# Patient Record
Sex: Female | Born: 1990
Health system: Southern US, Community
[De-identification: ages and names within clinical notes are randomized; demographics above are authoritative.]

---
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
## 2009-03-11 ENCOUNTER — Emergency Department (HOSPITAL_COMMUNITY): Admission: EM | Admit: 2009-03-11 | Discharge: 2009-03-11 | Payer: Self-pay | Admitting: Emergency Medicine

## 2012-01-22 ENCOUNTER — Ambulatory Visit (INDEPENDENT_AMBULATORY_CARE_PROVIDER_SITE_OTHER): Payer: BC Managed Care – PPO | Admitting: Physician Assistant

## 2012-01-22 VITALS — BP 134/74 | HR 96 | Temp 98.9°F | Resp 16 | Ht 65.0 in | Wt 132.0 lb

## 2012-01-22 DIAGNOSIS — J111 Influenza due to unidentified influenza virus with other respiratory manifestations: Secondary | ICD-10-CM

## 2012-01-22 DIAGNOSIS — J029 Acute pharyngitis, unspecified: Secondary | ICD-10-CM

## 2012-01-22 DIAGNOSIS — R6883 Chills (without fever): Secondary | ICD-10-CM

## 2012-01-22 DIAGNOSIS — R52 Pain, unspecified: Secondary | ICD-10-CM

## 2012-01-22 LAB — POCT RAPID STREP A (OFFICE): Rapid Strep A Screen: NEGATIVE

## 2012-01-22 MED ORDER — HYDROCODONE-HOMATROPINE 5-1.5 MG/5ML PO SYRP: 5.0000 mL | ORAL_SOLUTION | Freq: Three times a day (TID) | ORAL | Status: DC | PRN

## 2012-01-22 MED ORDER — FIRST-DUKES MOUTHWASH MT SUSP: 10.0000 mL | OROMUCOSAL | Status: DC | PRN

## 2012-01-22 MED ORDER — OSELTAMIVIR PHOSPHATE 75 MG PO CAPS: 75.0000 mg | ORAL_CAPSULE | Freq: Two times a day (BID) | ORAL | Status: DC

## 2012-01-22 NOTE — Patient Instructions (Addendum)
Begin taking the Tamiflu today.  Be sure to finish the full course.  Plenty of fluids and rest.  Good hand hygiene.  Cover your mouth when coughing or sneezing.  You may use Magic Mouthwash as frequently as every 2 hours for sore throat.  Advil 600mg  every 8 hours for fever/pain relief.  You may alternate this with Tylenol if needed.  Hycodan syrup for cough - this may make you sleepy.  Please let us know if you are worsening or not improving.     Influenza Facts Flu (influenza) is a contagious respiratory illness caused by the influenza viruses. It can cause mild to severe illness. While most healthy people recover from the flu without specific treatment and without complications, older people, young children, and people with certain health conditions are at higher risk for serious complications from the flu, including death. CAUSES   The flu virus is spread from person to person by respiratory droplets from coughing and sneezing.  A person can also become infected by touching an object or surface with a virus on it and then touching their mouth, eye or nose.  Adults may be able to infect others from 1 day before symptoms occur and up to 7 days after getting sick. So it is possible to give someone the flu even before you know you are sick and continue to infect others while you are sick. SYMPTOMS   Fever (usually high).  Headache.  Tiredness (can be extreme).  Cough.  Sore throat.  Runny or stuffy nose.  Body aches.  Diarrhea and vomiting may also occur, particularly in children.  These symptoms are referred to as "flu-like symptoms". A lot of different illnesses, including the common cold, can have similar symptoms. DIAGNOSIS   There are tests that can determine if you have the flu as long you are tested within the first 2 or 3 days of illness.  A doctor's exam and additional tests may be needed to identify if you have a disease that is a complicating the flu. RISKS AND  COMPLICATIONS  Some of the complications caused by the flu include:  Bacterial pneumonia or progressive pneumonia caused by the flu virus.  Loss of body fluids (dehydration).  Worsening of chronic medical conditions, such as heart failure, asthma, or diabetes.  Sinus problems and ear infections. HOME CARE INSTRUCTIONS   Seek medical care early on.  If you are at high risk from complications of the flu, consult your health-care provider as soon as you develop flu-like symptoms. Those at high risk for complications include:  People 65 years or older.  People with chronic medical conditions, including diabetes.  Pregnant women.  Young children.  Your caregiver may recommend use of an antiviral medication to help treat the flu.  If you get the flu, get plenty of rest, drink a lot of liquids, and avoid using alcohol and tobacco.  You can take over-the-counter medications to relieve the symptoms of the flu if your caregiver approves. (Never give aspirin to children or teenagers who have flu-like symptoms, particularly fever). PREVENTION  The single best way to prevent the flu is to get a flu vaccine each fall. Other measures that can help protect against the flu are:  Antiviral Medications  A number of antiviral drugs are approved for use in preventing the flu. These are prescription medications, and a doctor should be consulted before they are used.  Habits for Good Health  Cover your nose and mouth with a tissue when you  cough or sneeze, throw the tissue away after you use it.  Wash your hands often with soap and water, especially after you cough or sneeze. If you are not near water, use an alcohol-based hand cleaner.  Avoid people who are sick.  If you get the flu, stay home from work or school. Avoid contact with other people so that you do not make them sick, too.  Try not to touch your eyes, nose, or mouth as germs ore often spread this way. IN CHILDREN, EMERGENCY  WARNING SIGNS THAT NEED URGENT MEDICAL ATTENTION:  Fast breathing or trouble breathing.  Bluish skin color.  Not drinking enough fluids.  Not waking up or not interacting.  Being so irritable that the child does not want to be held.  Flu-like symptoms improve but then return with fever and worse cough.  Fever with a rash. IN ADULTS, EMERGENCY WARNING SIGNS THAT NEED URGENT MEDICAL ATTENTION:  Difficulty breathing or shortness of breath.  Pain or pressure in the chest or abdomen.  Sudden dizziness.  Confusion.  Severe or persistent vomiting. SEEK IMMEDIATE MEDICAL CARE IF:  You or someone you know is experiencing any of the symptoms above. When you arrive at the emergency center,report that you think you have the flu. You may be asked to wear a mask and/or sit in a secluded area to protect others from getting sick. MAKE SURE YOU:   Understand these instructions.  Monitor your condition.  Seek medical care if you are getting worse, or not improving. Document Released: 12/28/2002 Document Revised: 03/19/2011 Document Reviewed: 09/23/2008 [facility] Patient Information 2013 [place], [place].

## 2012-01-22 NOTE — Progress Notes (Signed)
Subjective:    Patient ID: Jordan Short, female    DOB: 1990/07/01, 22 y.o.   MRN: [id number]  HPI   Jordan Short is a 22 yr old female here stating Jordan Short is not feeling well.  Started coughing a little yesterday, then woke up in the middle of the feeling really bad.  Has had body aches and chills, not sure if Jordan Short's had a fever.  Took Advil about 2 hours ago, no temp currently.  Has sore throat and headache.  States cough is non-productive but Jordan Short "tastes blood" every time Jordan Short coughs.  Has not seen any blood.  No flu shot this season.  Sister and dad have been sick recently.     Review of Systems  Constitutional: Positive for chills. Negative for fever.  HENT: Positive for sore throat. Negative for congestion, rhinorrhea, sneezing, neck pain, neck stiffness and sinus pressure.   Respiratory: Positive for cough. Negative for shortness of breath and wheezing.   Cardiovascular: Negative.   Gastrointestinal: Positive for nausea. Negative for vomiting, abdominal pain and diarrhea.  Musculoskeletal: Positive for myalgias and arthralgias.  Skin: Negative.   Neurological: Positive for headaches.       Objective:   Physical Exam  Vitals reviewed. Constitutional: Jordan Short is oriented to person, place, and time. Jordan Short appears well-developed and well-nourished. Jordan Short appears ill. No distress.  HENT:  Head: Normocephalic and atraumatic.  Right Ear: Tympanic membrane and ear canal normal.  Left Ear: Tympanic membrane and ear canal normal.  Nose: Nose normal. Right sinus exhibits no maxillary sinus tenderness and no frontal sinus tenderness. Left sinus exhibits no maxillary sinus tenderness and no frontal sinus tenderness.  Mouth/Throat: Uvula is midline and mucous membranes are normal. Posterior oropharyngeal erythema present. No oropharyngeal exudate or posterior oropharyngeal edema.  Eyes: Conjunctivae normal are normal. No scleral icterus.  Neck: Neck supple.  Cardiovascular: Normal rate, regular rhythm, normal  heart sounds and intact distal pulses.  Exam reveals no gallop and no friction rub.   No murmur heard. Pulmonary/Chest: Effort normal and breath sounds normal. Jordan Short has no wheezes. Jordan Short has no rales.  Abdominal: Soft. Bowel sounds are normal. There is no tenderness.  Lymphadenopathy:    Jordan Short has no cervical adenopathy.  Neurological: Jordan Short is alert and oriented to person, place, and time.  Skin: Skin is warm and dry.  Psychiatric: Jordan Short has a normal mood and affect. Jordan Short behavior is normal.      Filed Vitals:   01/22/12 1414  BP: 134/74  Pulse: 96  Temp: 98.9 F (37.2 C)  Resp: 16     Results for orders placed in visit on 01/22/12  POCT RAPID STREP A (OFFICE)      Component Value Range   Rapid Strep A Screen Negative  Negative    Lab error with rapid flu test     Assessment & Plan:   1. Influenza-like illness  HYDROcodone-homatropine (HYCODAN) 5-1.5 MG/5ML syrup, oseltamivir (TAMIFLU) 75 MG capsule  2. Chills    3. Body aches    4. Sore throat  POCT rapid strep A, Diphenhyd-Hydrocort-Nystatin (FIRST-DUKES MOUTHWASH) SUSP    Jordan Short is a pleasant 22 yr old female with flu-like symptoms.  Rapid strep is negative.  A lab error occurred with the rapid flu test and it did not result.  Clinically this appears to be flu.  Gave the Jordan Short the option of re-swabbing for flu or treating empirically.  Jordan Short opted to treat empirically with Tamiflu.  Will treat cough with Hycodan  and sore throat with magic mouthwash.  Encouraged fluids and rest.  Discussed RTC precautions, Jordan Short understands and will return if worsening or not improving.

## 2013-11-25 ENCOUNTER — Encounter (HOSPITAL_COMMUNITY): Payer: Self-pay | Admitting: *Deleted

## 2013-11-25 ENCOUNTER — Emergency Department (HOSPITAL_COMMUNITY)
Admission: EM | Admit: 2013-11-25 | Discharge: 2013-11-25 | Disposition: A | Discharge status: Home / Self Care | Payer: BC Managed Care – PPO | Source: Home / Self Care | Attending: Family Medicine | Admitting: Family Medicine

## 2013-11-25 DIAGNOSIS — M533 Sacrococcygeal disorders, not elsewhere classified: Secondary | ICD-10-CM

## 2013-11-25 NOTE — Discharge Instructions (Signed)
You likely have irritated the SI joint in your low back. If you have discomfort in this area you can take ibuprofen 600 mg every 6 hours as needed. If this continues please call Dr Michaelle CopasSmith's office to schedule follow-up.   Back Pain, Adult Back pain is very common. The pain often gets better over time. The cause of back pain is usually not dangerous. Most people can learn to manage their back pain on their own.  HOME CARE   Stay active. Start with short walks on flat ground if you can. Try to walk farther each day.  Do not sit, drive, or stand in one place for more than 30 minutes. Do not stay in bed.  Do not avoid exercise or work. Activity can help your back heal faster.  Be careful when you bend or lift an object. Bend at your knees, keep the object close to you, and do not twist.  Sleep on a firm mattress. Lie on your side, and bend your knees. If you lie on your back, put a pillow under your knees.  Only take medicines as told by your doctor.  Put ice on the injured area.  Put ice in a plastic bag.  Place a towel between your skin and the bag.  Leave the ice on for 15-20 minutes, 03-04 times a day for the first 2 to 3 days. After that, you can switch between ice and heat packs.  Ask your doctor about back exercises or massage.  Avoid feeling anxious or stressed. Find good ways to deal with stress, such as exercise. GET HELP RIGHT AWAY IF:   Your pain does not go away with rest or medicine.  Your pain does not go away in 1 week.  You have new problems.  You do not feel well.  The pain spreads into your legs.  You cannot control when you poop (bowel movement) or pee (urinate).  Your arms or legs feel weak or lose feeling (numbness).  You feel sick to your stomach (nauseous) or throw up (vomit).  You have belly (abdominal) pain.  You feel like you may pass out (faint). MAKE SURE YOU:   Understand these instructions.  Will watch your condition.  Will get help  right away if you are not doing well or get worse. Document Released: 06/13/2007 Document Revised: 03/19/2011 Document Reviewed: 04/28/2013 [facility] Patient Information 2015 [place], [place]. This information is not intended to replace advice given to you by your health care provider. Make sure you discuss any questions you have with your health care provider.

## 2013-11-25 NOTE — ED Notes (Signed)
Pt  Reports  She noticed     What she  Described  As  A   Firm        Swollen  Area to  Lower  Back        Mildly     Sore to  Touch      denys  Any  specefic  Injury  But  Has  Been  Working  Out  Some  Lately

## 2013-11-25 NOTE — ED Provider Notes (Signed)
CSN: [id number]     Arrival date & time 11/25/13  1533 History   First MD Initiated Contact with Patient 11/25/13 1552     Chief Complaint  Patient presents with  . Back Pain   (Consider location/radiation/quality/duration/timing/severity/associated sxs/prior Treatment) Patient is a 23 y.o. female presenting with back pain. The history is provided by the patient.  Back Pain Location:  Sacro-iliac joint Quality:  Aching Radiates to:  Does not radiate Pain severity:  No pain Duration:  1 day Timing:  Intermittent Progression:  Resolved Chronicity:  New Context: lifting heavy objects   Associated symptoms: no fever, no numbness and no weakness    Patient is a 23 yo female presenting with back pain. Notes her back started to throb yesterday in her left lower back. She felt the area and noted a prominent area off the bone that she had not noted previously. She notes her pain has resolved. It did not radiate. There is no weakness or numbness. Denies saddle anesthesia, bowel or bladder incontinence, fevers, and history of cancer. She does note she has been going to the gym more recently.   History reviewed. No pertinent past medical history. History reviewed. No pertinent past surgical history. Family History  Problem Relation Age of Onset  . Hypertension Mother   . Hypertension Father    History  Substance Use Topics  . Smoking status: Never Smoker   . Smokeless tobacco: Not on file  . Alcohol Use: No   OB History    No data available     Review of Systems  Constitutional: Negative for fever.  Musculoskeletal: Positive for back pain. Negative for gait problem.       Bony prominence in low back  Neurological: Negative for weakness and numbness.    Allergies  Review of patient's allergies indicates no known allergies.  Home Medications   Prior to Admission medications   Medication Sig Start Date End Date Taking? Authorizing Provider  Diphenhyd-Hydrocort-Nystatin  (FIRST-DUKES MOUTHWASH) SUSP Take 10 mLs by mouth every 2 (two) hours as needed. With 1:1 ratio viscous lidocaine 01/22/12   Godfrey PickEleanore E Egan, PA-C  etonogestrel-ethinyl estradiol (NUVARING) 0.12-0.015 MG/24HR vaginal ring Place 1 each vaginally every 28 (twenty-eight) days. Insert vaginally and leave in place for 3 consecutive weeks, then remove for 1 week.    Historical Provider, MD  HYDROcodone-homatropine (HYCODAN) 5-1.5 MG/5ML syrup Take 5 mLs by mouth every 8 (eight) hours as needed for cough. 01/22/12   Eleanore Delia ChimesE Egan, PA-C  oseltamivir (TAMIFLU) 75 MG capsule Take 1 capsule (75 mg total) by mouth 2 (two) times daily. 01/22/12   Eleanore E Egan, PA-C   BP 133/82 mmHg  Pulse 77  Temp(Src) 98.5 F (36.9 C) (Oral)  Resp 16  SpO2 96%  LMP 11/04/2013 Physical Exam  Constitutional: She appears well-developed and well-nourished.  HENT:  Head: Normocephalic and atraumatic.  Cardiovascular: Normal rate, regular rhythm and normal heart sounds.   Pulmonary/Chest: Effort normal and breath sounds normal.  Musculoskeletal: She exhibits no edema.  No midline spinal tenderness, no tenderness of back in any distribution, there is a small nodule over the left SI joint, there is no overlying erythema or swelling, there is no tenderness to this area  Neurological:  5/5 strength in bilateral quads, hamstrings, plantar and dorsiflexion, sensation to light touch intact in bilateral LE, normal gait, 2+ patellar reflexes  Skin: Skin is warm and dry.    ED Course  Procedures (including critical care time) Labs Review Labs  Reviewed - No data to display  Imaging Review No results found.   MDM   1. SI (sacroiliac) joint dysfunction    Patient with likely fascial swelling off of the SI joint relating to recent increase in physical activity. No red flags on history or exam. Neurologically intact. Discussed ibuprofen and tylenol for any future discomfort. Is to f/u with Dr Terrilee FilesZach Smith if not improving in  4-6 weeks. Given return precautions.   This patient was discussed with and seen by Dr Denyse Amassorey. He helped formulate the plan of care for the patient.   Marikay AlarEric Anne [place], MD [facility] PGY3    Glori LuisEric G Tamela Elsayed, MD 11/25/13 1630

## 2018-03-10 ENCOUNTER — Encounter (HOSPITAL_COMMUNITY): Payer: Self-pay | Admitting: Emergency Medicine

## 2018-03-10 ENCOUNTER — Ambulatory Visit (HOSPITAL_COMMUNITY)
Admission: EM | Admit: 2018-03-10 | Discharge: 2018-03-10 | Disposition: A | Discharge status: Home / Self Care | Payer: BLUE CROSS/BLUE SHIELD | Attending: Family Medicine | Admitting: Family Medicine

## 2018-03-10 ENCOUNTER — Ambulatory Visit (INDEPENDENT_AMBULATORY_CARE_PROVIDER_SITE_OTHER): Payer: BLUE CROSS/BLUE SHIELD

## 2018-03-10 DIAGNOSIS — W19XXXA Unspecified fall, initial encounter: Secondary | ICD-10-CM

## 2018-03-10 DIAGNOSIS — M25572 Pain in left ankle and joints of left foot: Secondary | ICD-10-CM

## 2018-03-10 DIAGNOSIS — M25472 Effusion, left ankle: Secondary | ICD-10-CM | POA: Diagnosis not present

## 2018-03-10 DIAGNOSIS — S93492A Sprain of other ligament of left ankle, initial encounter: Secondary | ICD-10-CM | POA: Diagnosis not present

## 2018-03-10 NOTE — ED Triage Notes (Signed)
Pt here for left ankle pain after twisting 2 days ago; bruising and swelling noted

## 2018-03-10 NOTE — ED Notes (Signed)
Medium ASO applied to patient's left ankle by Aggie Cosier, CMA

## 2018-03-10 NOTE — ED Provider Notes (Signed)
[facility]   [id number] 03/10/18 Arrival Time: 6384  ASSESSMENT & PLAN:  1. Sprain of anterior talofibular ligament of left ankle, initial encounter     I have personally viewed the imaging studies ordered this visit. No fracture or dislocation observed.  Imaging: Dg Ankle Complete Left  Result Date: 03/10/2018 CLINICAL DATA:  Fall 2 days ago with left ankle pain and swelling extending to the base of the fifth metatarsal. Evaluate for fracture. EXAM: LEFT ANKLE COMPLETE - 3+ VIEW COMPARISON:  None. FINDINGS: Soft tissue swelling about the lateral malleolus without associated fracture or dislocation. Joint spaces are preserved. Ankle mortise is preserved. Potential small ankle joint effusion. No plantar calcaneal spur. No radiopaque foreign body. IMPRESSION: Soft tissue swelling about the lateral malleolus with potential small ankle joint effusion but without associated fracture or dislocation. Electronically Signed   By: Simonne Come M.D.   On: 03/10/2018 10:38   Orders Placed This Encounter  Procedures  . DG Ankle Complete Left  . Apply ASO ankle   AVS with written ankle sprain instructions. WBAT.  Follow-up Information    [facility] In 1 week.   Specialty:  Urgent Care Why:  If not improving. Contact information: [address] [place] [address] [phone]          Reviewed expectations re: course of current medical issues. Questions answered. Outlined signs and symptoms indicating need for more acute intervention. Patient verbalized understanding. After Visit Summary given.  SUBJECTIVE: History from: patient. Jordan Short is a 28 y.o. female who reports fairly persistent mild to moderate pain of her left ankle; described as aching without radiation. Onset: abrupt, 2 days ago. Injury/trama: yes, reports "twisting" ankle; mild swelling noted. Able to bear weight immediately and since "but hurts". Symptoms  have progressed to a point and plateaued since beginning. Aggravating factors: certain movements. Alleviating factors: rest. Associated symptoms: none reported. Extremity sensation changes or weakness: none. Self treatment: has not tried OTCs for relief of pain. History of similar: no.  History reviewed. No pertinent surgical history.   ROS: As per HPI.   OBJECTIVE:  Vitals:   03/10/18 1014  BP: 138/84  Pulse: 68  Resp: 20  Temp: 98.4 F (36.9 C)  TempSrc: Oral  SpO2: 100%    General appearance: alert; no distress Extremities: . LLE: warm and well perfused; fairly well localized mild tenderness over left lateral ankle (ATFL distribution); without gross deformities; with mild swelling; with no bruising; ROM: normal CV: brisk extremity capillary refill of LLE; 2+ DP and PT pulse of LLE. Skin: warm and dry; no visible rashes Neurologic: gait normal; normal reflexes of RLE and LLE; normal sensation of RLE and LLE; normal strength of RLE and LLE Psychological: alert and cooperative; normal mood and affect  No Known Allergies  History reviewed. No pertinent past medical history. Social History   Socioeconomic History  . Marital status: Single    Spouse name: Not on file  . Number of children: Not on file  . Years of education: Not on file  . Highest education level: Not on file  Occupational History  . Not on file  Social Needs  . Financial resource strain: Not on file  . Food insecurity:    Worry: Not on file    Inability: Not on file  . Transportation needs:    Medical: Not on file    Non-medical: Not on file  Tobacco Use  . Smoking status: Never  Smoker  . Smokeless tobacco: Never Used  Substance and Sexual Activity  . Alcohol use: No  . Drug use: No  . Sexual activity: Yes    Comment: NuvaRing  Lifestyle  . Physical activity:    Days per week: Not on file    Minutes per session: Not on file  . Stress: Not on file  Relationships  . Social connections:      Talks on phone: Not on file    Gets together: Not on file    Attends religious service: Not on file    Active member of club or organization: Not on file    Attends meetings of clubs or organizations: Not on file    Relationship status: Not on file  Other Topics Concern  . Not on file  Social History Narrative  . Not on file   Family History  Problem Relation Age of Onset  . Hypertension Mother   . Hypertension Father    History reviewed. No pertinent surgical history.    Mardella Layman, MD 03/12/18 [phone]

## 2018-03-10 NOTE — Discharge Instructions (Signed)
You have been diagnosed with an ankle sprain today. Please wear the ankle brace provided for the next week. If possible, elevate your ankle when seated. Applying ice for 20 minutes at a time every hour as needed may help with any pain for swelling you may have. You may also take Ibuprofen 3 times daily for pain and inflammation. Follow up with your doctor or an orthopaedist in 1 week if you are not seeing significant improvement.

## 2018-08-12 DIAGNOSIS — Z6825 Body mass index (BMI) 25.0-25.9, adult: Secondary | ICD-10-CM | POA: Diagnosis not present

## 2018-08-12 DIAGNOSIS — Z130 Encounter for screening for diseases of the blood and blood-forming organs and certain disorders involving the immune mechanism: Secondary | ICD-10-CM | POA: Diagnosis not present

## 2018-08-12 DIAGNOSIS — Z01419 Encounter for gynecological examination (general) (routine) without abnormal findings: Secondary | ICD-10-CM | POA: Diagnosis not present

## 2018-08-12 DIAGNOSIS — Z3044 Encounter for surveillance of vaginal ring hormonal contraceptive device: Secondary | ICD-10-CM | POA: Diagnosis not present

## 2018-08-13 DIAGNOSIS — H5213 Myopia, bilateral: Secondary | ICD-10-CM | POA: Diagnosis not present

## 2018-08-13 DIAGNOSIS — H04123 Dry eye syndrome of bilateral lacrimal glands: Secondary | ICD-10-CM | POA: Diagnosis not present

## 2018-08-20 ENCOUNTER — Other Ambulatory Visit: Payer: Self-pay

## 2018-08-20 DIAGNOSIS — Z20822 Contact with and (suspected) exposure to covid-19: Secondary | ICD-10-CM

## 2018-08-21 LAB — NOVEL CORONAVIRUS, NAA: SARS-CoV-2, NAA: DETECTED — AB

## 2018-08-22 ENCOUNTER — Telehealth: Payer: BLUE CROSS/BLUE SHIELD

## 2018-08-23 ENCOUNTER — Telehealth: Payer: Self-pay | Admitting: Critical Care Medicine

## 2018-08-23 NOTE — Telephone Encounter (Signed)
I connected with this patient who apparently became ill on around 9 August underwent testing on the 12th which was positive for COVID.  The patient's had nasal congestion temperature of 101 sore throat and a slight cough and alterations in smell and taste.  Patient also has had diarrhea.  The patient is actually now rapidly getting better.  The patient denies any fatigue.  The patient does not have a primary care physician.  She mainly is suffering from anxiety over the positive test result.  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  Her father understands that he has been exposed to her.  She is self isolating and we discussed that that self-isolation should continue at least until 21 August.  The patient knows to go to the urgent care or emergency room if her symptoms worsen.  She is interested in being retesting I told her we are not recommending a retest to test for cure however some employer is requesting a retest I told her if her employer is requesting a retest the earliest that should be would be the week of 24 August

## 2018-09-05 ENCOUNTER — Other Ambulatory Visit: Payer: Self-pay

## 2018-09-05 ENCOUNTER — Ambulatory Visit (INDEPENDENT_AMBULATORY_CARE_PROVIDER_SITE_OTHER)
Admission: RE | Admit: 2018-09-05 | Discharge: 2018-09-05 | Disposition: A | Discharge status: Home / Self Care | Payer: BC Managed Care – PPO | Source: Ambulatory Visit

## 2018-09-05 DIAGNOSIS — Z8619 Personal history of other infectious and parasitic diseases: Secondary | ICD-10-CM | POA: Diagnosis not present

## 2018-09-05 DIAGNOSIS — Z711 Person with feared health complaint in whom no diagnosis is made: Secondary | ICD-10-CM | POA: Diagnosis not present

## 2018-09-05 NOTE — ED Provider Notes (Signed)
Virtual Visit via Video Note:  Jordan Short  initiated request for Telemedicine visit with [facility] team. I connected with Jordan Short  on 09/08/2018 at 8:17 AM  for a synchronized telemedicine visit using a video enabled HIPPA compliant telemedicine application. I verified that I am speaking with Jordan Short  using two identifiers. Orvan July, NP  was physically located in a [facility] Urgent care site and Myrtle Haller was located at a different location.   The limitations of evaluation and management by telemedicine as well as the availability of in-person appointments were discussed. Patient was informed that she  may incur a bill ( including co-pay) for this virtual visit encounter. Jordan Short  expressed understanding and gave verbal consent to proceed with virtual visit.     History of Present Illness:Jordan Short  is a 28 y.o. female presents with being worried. She was dx with COVID a few weeks ago and is feeling better. She is concerned if she is still contagious and worried about being around her dad and being around people. She wanted recommendations on being retested. No symptoms.   History reviewed. No pertinent past medical history.  No Known Allergies      Observations/Objective: VITALS: Per patient if applicable, see vitals. GENERAL: Alert, appears well and in no acute distress. HEENT: Atraumatic, conjunctiva clear, no obvious abnormalities on inspection of external nose and ears. NECK: Normal movements of the head and neck. CARDIOPULMONARY: No increased WOB. Speaking in clear sentences. I:E ratio WNL.  MS: Moves all visible extremities without noticeable abnormality. PSYCH: Pleasant and cooperative, well-groomed. Speech normal rate and rhythm. Affect is appropriate. Insight and judgement are appropriate. Attention is focused, linear, and appropriate.  NEURO: CN grossly intact. Oriented as arrived to appointment on time with no prompting. Moves both UE equally.   SKIN: No obvious lesions, wounds, erythema, or cyanosis noted on face or hands.     Assessment and Plan: Reassured patient and gave the CDC recommendations. No need for retesting   Follow Up Instructions: Follow up as needed for continued or worsening symptoms     I discussed the assessment and treatment plan with the patient. The patient was provided an opportunity to ask questions and all were answered. The patient agreed with the plan and demonstrated an understanding of the instructions.   The patient was advised to call back or seek an in-person evaluation if the symptoms worsen or if the condition fails to improve as anticipated.     Orvan July, NP  09/08/2018 8:17 AM          Orvan July, NP 09/08/18 [phone]

## 2019-08-06 ENCOUNTER — Telehealth: Payer: Self-pay

## 2019-08-06 NOTE — Telephone Encounter (Signed)
Lmom to confirm and screen for 08-10-19 ov. 

## 2019-08-07 ENCOUNTER — Telehealth: Payer: Self-pay

## 2019-08-07 NOTE — Telephone Encounter (Signed)
Confirmed appointment on 08/10/2019 and screened for covid. klh 

## 2019-08-10 ENCOUNTER — Other Ambulatory Visit: Payer: Self-pay

## 2019-08-10 ENCOUNTER — Ambulatory Visit: Payer: BC Managed Care – PPO | Admitting: Adult Health

## 2019-08-10 ENCOUNTER — Encounter: Payer: Self-pay | Admitting: Nurse Practitioner

## 2019-08-10 VITALS — BP 123/74 | HR 87 | Temp 98.1°F | Resp 16 | Ht 65.0 in | Wt 158.4 lb

## 2019-08-10 DIAGNOSIS — R002 Palpitations: Secondary | ICD-10-CM

## 2019-08-10 DIAGNOSIS — F419 Anxiety disorder, unspecified: Secondary | ICD-10-CM | POA: Diagnosis not present

## 2019-08-10 DIAGNOSIS — F439 Reaction to severe stress, unspecified: Secondary | ICD-10-CM

## 2019-08-10 DIAGNOSIS — R197 Diarrhea, unspecified: Secondary | ICD-10-CM

## 2019-08-10 DIAGNOSIS — Z7689 Persons encountering health services in other specified circumstances: Secondary | ICD-10-CM | POA: Diagnosis not present

## 2019-08-10 DIAGNOSIS — R5383 Other fatigue: Secondary | ICD-10-CM

## 2019-08-10 MED ORDER — ALPRAZOLAM 0.25 MG PO TABS: ORAL_TABLET | ORAL | 0 refills | Status: DC

## 2019-08-10 NOTE — Progress Notes (Signed)
[facility] [address] [place], [place] [address]  Internal MEDICINE  Office Visit Note  Patient Name: Jordan Short  [id number]  [id number]  Date of Service: 08/10/2019   Complaints/HPI Pt is here for establishment of PCP. Chief Complaint  Patient presents with  . New Patient (Initial Visit)    gut issues  . Anxiety  . Quality Metric Gaps    pap, covid vaccine, hep C   HPI Pt is here establish care.  She is a well appearing 29 yo female. 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 She sees GYN for her PAPs.  She denies any significant medical history.  She is here to day to discuss some ongoing anxiety.  She describes herself as an anxious person, and she feels like this has become worse over the last 2 years.  She has never been prescribed any daily medications. She reports she has had a few self described panic attacks and they are becoming more frequent.  She also complains of some IBS like symptoms.  She feels like she goes from constipated to diarrhea often.  She reports regular bowel movements every 2-3 days.   Pt is also palpitations at times with her anxiety.  She reports she will wake up gasping in the middle of the night, and she panics.  She describes it as feeling like her heart will stop.   Current Medication: Outpatient Encounter Medications as of 08/10/2019  Medication Sig  . etonogestrel-ethinyl estradiol (NUVARING) 0.12-0.015 MG/24HR vaginal ring Place 1 each vaginally every 28 (twenty-eight) days. Insert vaginally and leave in place for 3 consecutive weeks, then remove for 1 week.  . ALPRAZolam (XANAX) 0.25 MG tablet Half to one tab tab as needed for panic attacks   No facility-administered encounter medications on file as of 08/10/2019.    Surgical History: History reviewed. No pertinent surgical history.  Medical History: History reviewed. No pertinent past medical history.  Family  History: Family History  Problem Relation Age of Onset  . Hypertension Mother   . Hypertension Father     Social History   Socioeconomic History  . Marital status: Single    Spouse name: Not on file  . Number of children: Not on file  . Years of education: Not on file  . Highest education level: Not on file  Occupational History  . Not on file  Tobacco Use  . Smoking status: Never Smoker  . Smokeless tobacco: Never Used  Substance and Sexual Activity  . Alcohol use: Yes    Comment: occ  . Drug use: Never  . Sexual activity: Yes    Comment: NuvaRing  Other Topics Concern  . Not on file  Social History Narrative  . Not on file   Social Determinants of Health   Financial Resource Strain:   . Difficulty of Paying Living Expenses:   Food Insecurity:   . Worried About Programme researcher, broadcasting/film/video in the Last Year:   . Barista in the Last Year:   Transportation Needs:   . Freight forwarder (Medical):   Marland Kitchen Lack of Transportation (Non-Medical):   Physical Activity:   . Days of Exercise per Week:   . Minutes of Exercise per Session:   Stress:   . Feeling of Stress :   Social Connections:   . Frequency of Communication with Friends and Family:   . Frequency of Social Gatherings with Friends  and Family:   . Attends Religious Services:   . Active Member of Clubs or Organizations:   . Attends Banker Meetings:   Marland Kitchen Marital Status:   Intimate Partner Violence:   . Fear of Current or Ex-Partner:   . Emotionally Abused:   Marland Kitchen Physically Abused:   . Sexually Abused:      Review of Systems  Constitutional: Negative for chills, fatigue and unexpected weight change.  HENT: Negative for congestion, rhinorrhea, sneezing and sore throat.   Eyes: Negative for photophobia, pain and redness.  Respiratory: Negative for cough, chest tightness and shortness of breath.   Cardiovascular: Negative for chest pain and palpitations.  Gastrointestinal: Negative for  abdominal pain, constipation, diarrhea, nausea and vomiting.  Endocrine: Negative.   Genitourinary: Negative for dysuria and frequency.  Musculoskeletal: Negative for arthralgias, back pain, joint swelling and neck pain.  Skin: Negative for rash.  Allergic/Immunologic: Negative.   Neurological: Negative for tremors and numbness.  Hematological: Negative for adenopathy. Does not bruise/bleed easily.  Psychiatric/Behavioral: Negative for behavioral problems and sleep disturbance. The patient is not nervous/anxious.     Vital Signs: BP 123/74   Pulse 87   Temp 98.1 F (36.7 C)   Resp 16   Ht 5\' 5"  (1.651 m)   Wt 158 lb 6.4 oz (71.8 kg)   SpO2 99%   BMI 26.36 kg/m    Physical Exam Vitals and nursing note reviewed.  Constitutional:      General: She is not in acute distress.    Appearance: She is well-developed. She is not diaphoretic.  HENT:     Head: Normocephalic and atraumatic.     Mouth/Throat:     Pharynx: No oropharyngeal exudate.  Eyes:     Pupils: Pupils are equal, round, and reactive to light.  Neck:     Thyroid: No thyromegaly.     Vascular: No JVD.     Trachea: No tracheal deviation.  Cardiovascular:     Rate and Rhythm: Normal rate and regular rhythm.     Heart sounds: Normal heart sounds. No murmur heard.  No friction rub. No gallop.   Pulmonary:     Effort: Pulmonary effort is normal. No respiratory distress.     Breath sounds: Normal breath sounds. No wheezing or rales.  Chest:     Chest wall: No tenderness.  Abdominal:     Palpations: Abdomen is soft.     Tenderness: There is no abdominal tenderness. There is no guarding.  Musculoskeletal:        General: Normal range of motion.     Cervical back: Normal range of motion and neck supple.  Lymphadenopathy:     Cervical: No cervical adenopathy.  Skin:    General: Skin is warm and dry.  Neurological:     Mental Status: She is alert and oriented to person, place, and time.     Cranial Nerves: No  cranial nerve deficit.  Psychiatric:        Behavior: Behavior normal.        Thought Content: Thought content normal.        Judgment: Judgment normal.     Assessment/Plan: 1. Encounter to establish care with new doctor Discussed need for Pap, will discuss further at next visit.  - CBC with Differential/Platelet - Lipid Panel With LDL/HDL Ratio - TSH - T4, free - Comprehensive metabolic panel  2. Stress Reviewed risks and possible side effects associated with taking opiates, benzodiazepines and other CNS depressants.  Combination of these could cause dizziness and drowsiness. Advised patient not to drive or operate machinery when taking these medications, as patient's and other's life can be at risk and will have consequences. Patient verbalized understanding in this matter. Dependence and abuse for these drugs will be monitored closely. A Controlled substance policy and procedure is on file which allows [facility] to order a urine drug screen test at any visit. Patient understands and agrees with the plan - ALPRAZolam (XANAX) 0.25 MG tablet; Half to one tab tab as needed for panic attacks  Dispense: 20 tablet; Refill: 0  3. Anxiety Stable, continue Xanax as directed.  - ALPRAZolam (XANAX) 0.25 MG tablet; Half to one tab tab as needed for panic attacks  Dispense: 20 tablet; Refill: 0  4. Diarrhea, unspecified type Continue dietary modifications and keep food log.   5. Other fatigue - Vitamin D (25 hydroxy) - B12 and Folate Panel - Fe+TIBC+Fer  6. Palpitation Intermittent, will review labs and we discussed posiblely ordering holter monitor and echo.   General Counseling: Kriti verbalizes understanding of the findings of todays visit and agrees with plan of treatment. I have discussed any further diagnostic evaluation that may be needed or ordered today. We also reviewed her medications today. she has been encouraged to call the office with any questions or concerns  that should arise related to todays visit.  Orders Placed This Encounter  Procedures  . CBC with Differential/Platelet  . Lipid Panel With LDL/HDL Ratio  . TSH  . T4, free  . Comprehensive metabolic panel  . Vitamin D (25 hydroxy)  . B12 and Folate Panel  . Fe+TIBC+Fer    Meds ordered this encounter  Medications  . ALPRAZolam (XANAX) 0.25 MG tablet    Sig: Half to one tab tab as needed for panic attacks    Dispense:  20 tablet    Refill:  0    Time spent: 30 Minutes   This patient was seen by Blima Ledger AGNP-C in Collaboration with Dr Lyndon Code as a part of collaborative care agreement  Johnna Acosta AGNP-C Internal Medicine

## 2019-08-19 DIAGNOSIS — R5383 Other fatigue: Secondary | ICD-10-CM | POA: Diagnosis not present

## 2019-08-19 DIAGNOSIS — Z7689 Persons encountering health services in other specified circumstances: Secondary | ICD-10-CM | POA: Diagnosis not present

## 2019-08-20 LAB — CBC WITH DIFFERENTIAL/PLATELET

## 2019-08-20 LAB — COMPREHENSIVE METABOLIC PANEL
ALT: 12 IU/L (ref 0–32)
AST: 12 IU/L (ref 0–40)
Albumin/Globulin Ratio: 2 (ref 1.2–2.2)
Albumin: 4.7 g/dL (ref 3.8–4.8)
Alkaline Phosphatase: 88 IU/L (ref 48–121)
BUN/Creatinine Ratio: 7 — ABNORMAL LOW (ref 9–23)
BUN: 6 mg/dL (ref 6–20)
Bilirubin Total: 0.2 mg/dL (ref 0.0–1.2)
CO2: 17 mmol/L — ABNORMAL LOW (ref 20–29)
Calcium: 9.2 mg/dL (ref 8.7–10.2)
Chloride: 104 mmol/L (ref 96–106)
Creatinine, Ser: 0.88 mg/dL (ref 0.57–1.00)
GFR calc Af Amer: 101 mL/min/{1.73_m2} (ref >59)
GFR calc non Af Amer: 88 mL/min/{1.73_m2} (ref >59)
Globulin, Total: 2.4 g/dL (ref 1.5–4.5)
Glucose: 103 mg/dL — ABNORMAL HIGH (ref 65–99)
Potassium: 4 mmol/L (ref 3.5–5.2)
Sodium: 140 mmol/L (ref 134–144)
Total Protein: 7.1 g/dL (ref 6.0–8.5)

## 2019-08-20 LAB — IRON,TIBC AND FERRITIN PANEL
Ferritin: 38 ng/mL (ref 15–150)
Iron Saturation: 16 % (ref 15–55)
Iron: 68 ug/dL (ref 27–159)
Total Iron Binding Capacity: 420 ug/dL (ref 250–450)
UIBC: 352 ug/dL (ref 131–425)

## 2019-08-20 LAB — LIPID PANEL WITH LDL/HDL RATIO
Cholesterol, Total: 169 mg/dL (ref 100–199)
HDL: 77 mg/dL (ref >39)
LDL Chol Calc (NIH): 80 mg/dL (ref 0–99)
LDL/HDL Ratio: 1 ratio (ref 0.0–3.2)
Triglycerides: 59 mg/dL (ref 0–149)
VLDL Cholesterol Cal: 12 mg/dL (ref 5–40)

## 2019-08-20 LAB — VITAMIN D 25 HYDROXY (VIT D DEFICIENCY, FRACTURES): Vit D, 25-Hydroxy: 47 ng/mL (ref 30.0–100.0)

## 2019-08-20 LAB — TSH: TSH: 3.15 u[IU]/mL (ref 0.450–4.500)

## 2019-08-20 LAB — T4, FREE: Free T4: 1.33 ng/dL (ref 0.82–1.77)

## 2019-08-20 LAB — B12 AND FOLATE PANEL
Folate: 13.5 ng/mL (ref >3.0)
Vitamin B-12: 300 pg/mL (ref 232–1245)

## 2019-08-25 ENCOUNTER — Telehealth: Payer: Self-pay

## 2019-08-25 IMAGING — DX DG ANKLE COMPLETE 3+V*L*
3 series · 3 of 3 positions shown · non-contrast
Comparison: None.

CLINICAL DATA: Fall 2 days ago with left ankle pain and swelling
extending to the base of the fifth metatarsal. Evaluate for
fracture.

EXAM:
LEFT ANKLE COMPLETE - 3+ VIEW

[ankle ap]
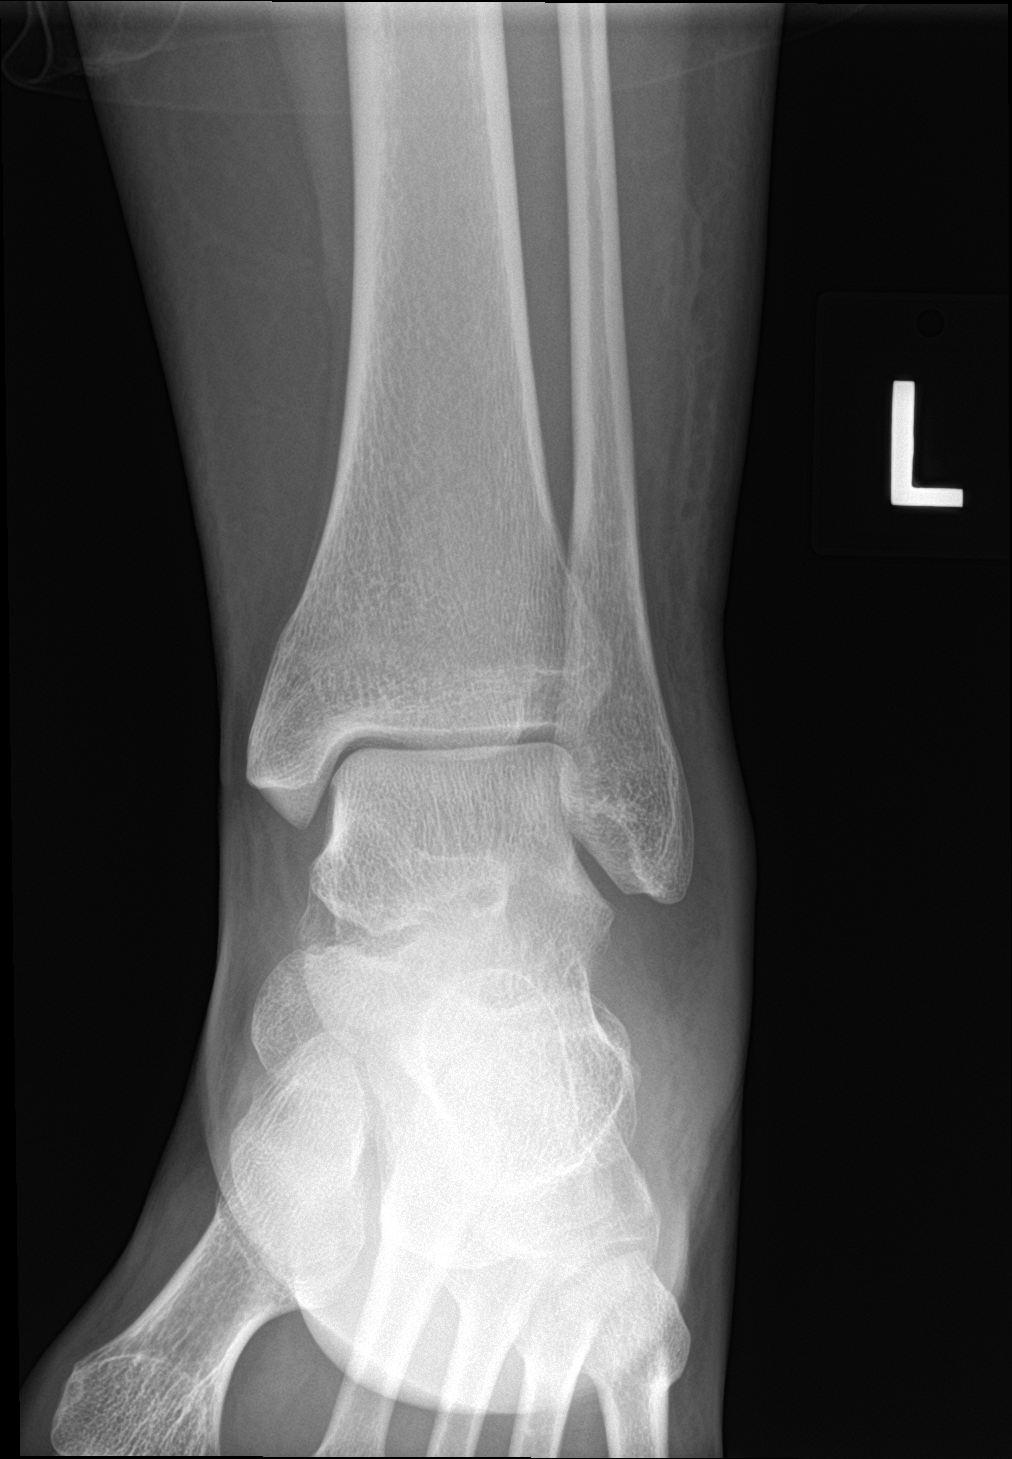

[ankle obl]
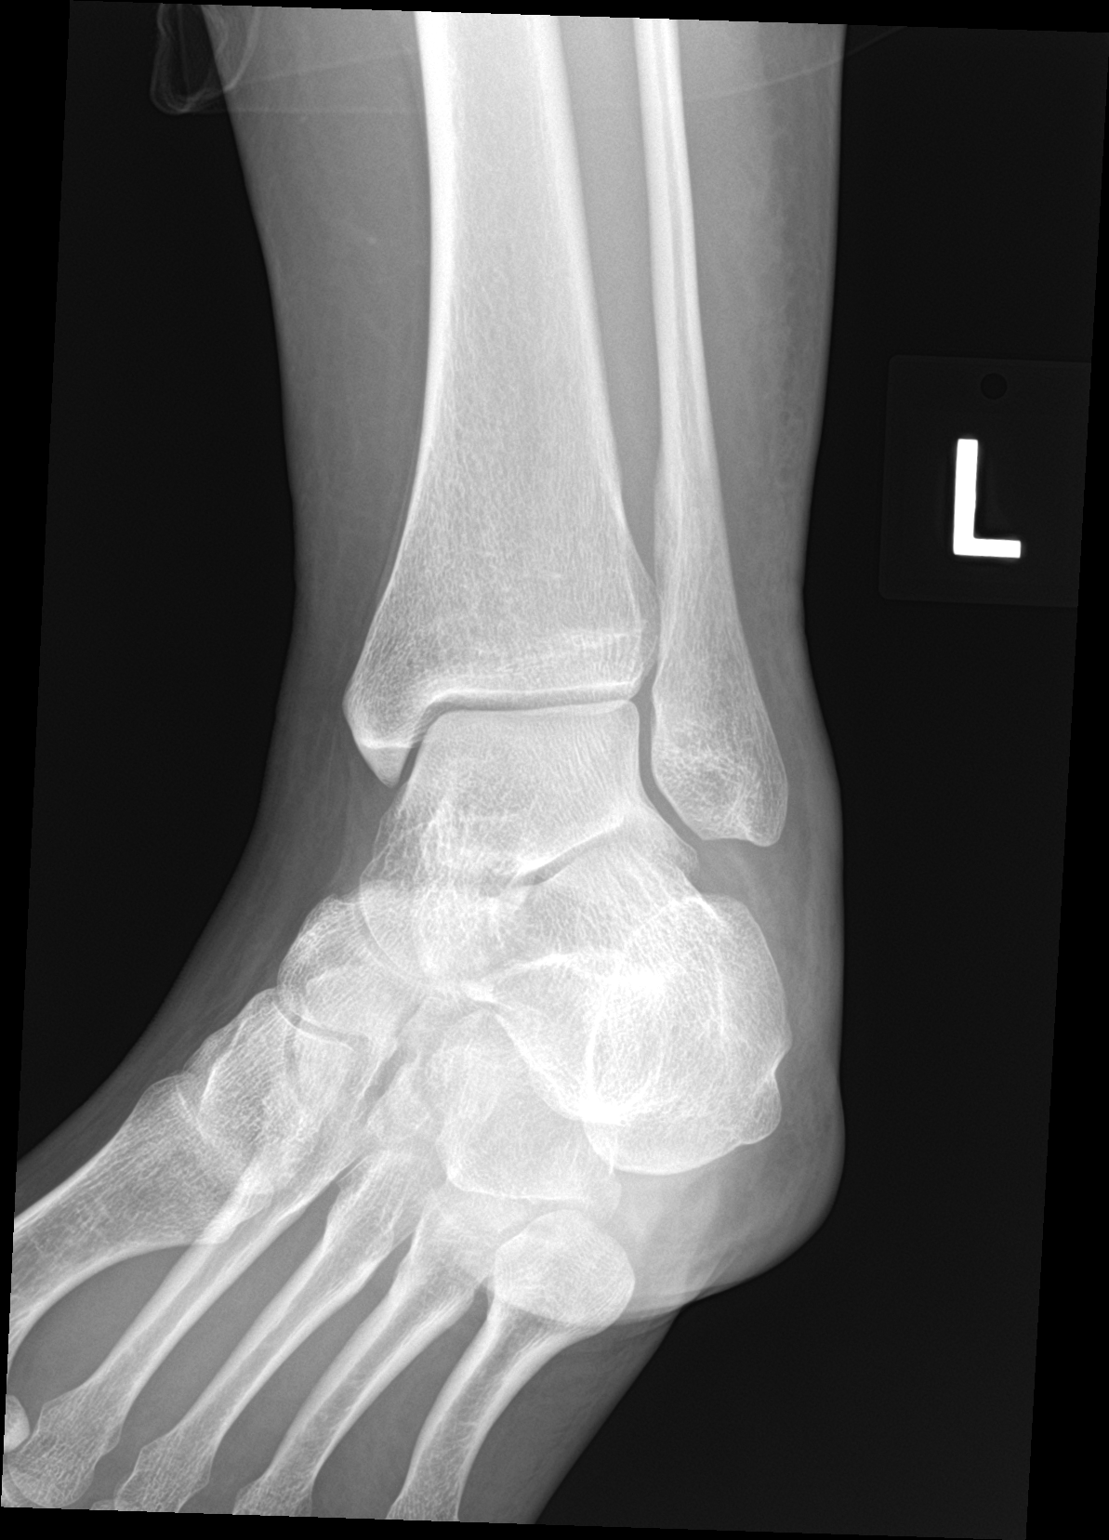

[ankle lat]
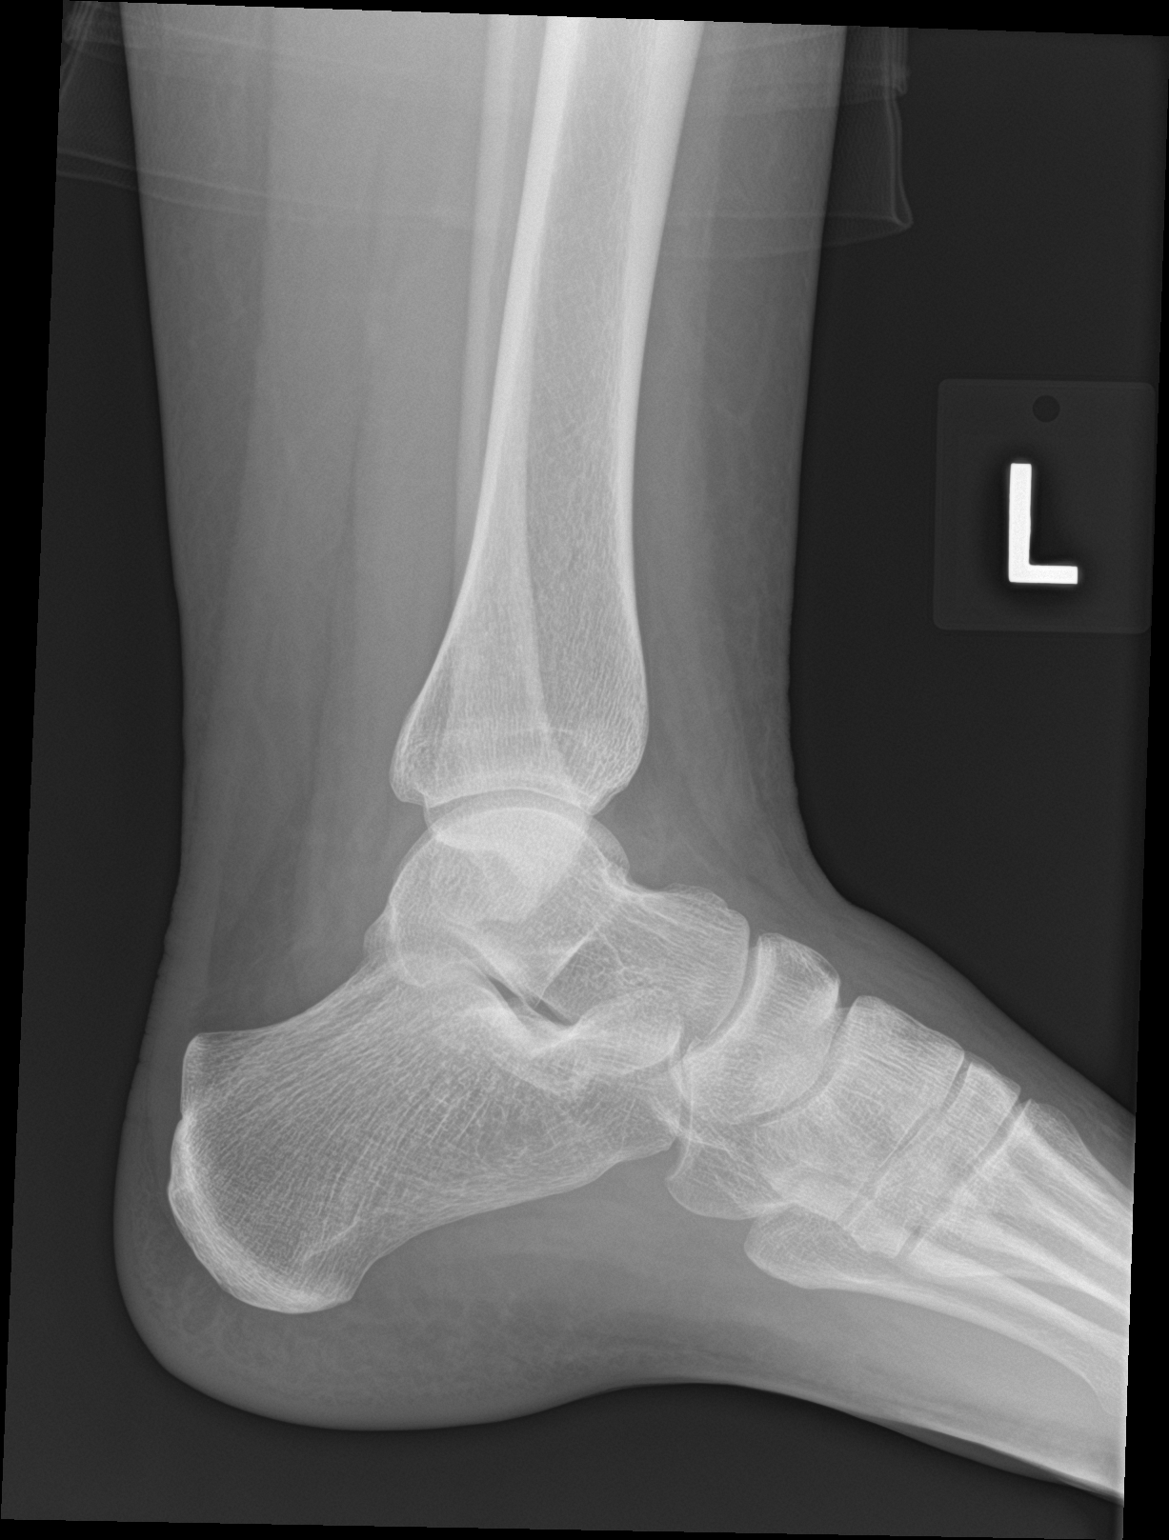

[3 of 3 positions shown; findings below may reference images not displayed]

FINDINGS: Soft tissue swelling about the lateral malleolus without associated
fracture or dislocation. Joint spaces are preserved. Ankle mortise
is preserved. Potential small ankle joint effusion. No plantar
calcaneal spur. No radiopaque foreign body.
IMPRESSION: Soft tissue swelling about the lateral malleolus with potential
small ankle joint effusion but without associated fracture or
dislocation.

## 2019-08-25 NOTE — Telephone Encounter (Signed)
-----   Message from Lyndon Code, MD sent at 08/25/2019 10:00 AM EDT ----- Labs will be discussed on next visit

## 2019-08-25 NOTE — Telephone Encounter (Signed)
Pt.notified

## 2019-08-28 ENCOUNTER — Telehealth: Payer: Self-pay

## 2019-08-28 NOTE — Telephone Encounter (Signed)
Confirmed and screened for 09-01-19 ov. 

## 2019-09-01 ENCOUNTER — Other Ambulatory Visit: Payer: Self-pay

## 2019-09-01 ENCOUNTER — Ambulatory Visit (INDEPENDENT_AMBULATORY_CARE_PROVIDER_SITE_OTHER): Payer: BC Managed Care – PPO | Admitting: Nurse Practitioner

## 2019-09-01 VITALS — BP 141/86 | HR 92 | Temp 97.9°F | Resp 16 | Ht 65.0 in | Wt 155.2 lb

## 2019-09-01 DIAGNOSIS — R300 Dysuria: Secondary | ICD-10-CM | POA: Diagnosis not present

## 2019-09-01 DIAGNOSIS — R0602 Shortness of breath: Secondary | ICD-10-CM

## 2019-09-01 DIAGNOSIS — Z0001 Encounter for general adult medical examination with abnormal findings: Secondary | ICD-10-CM | POA: Diagnosis not present

## 2019-09-01 DIAGNOSIS — R002 Palpitations: Secondary | ICD-10-CM

## 2019-09-01 DIAGNOSIS — F419 Anxiety disorder, unspecified: Secondary | ICD-10-CM

## 2019-09-01 DIAGNOSIS — F439 Reaction to severe stress, unspecified: Secondary | ICD-10-CM

## 2019-09-01 MED ORDER — ALPRAZOLAM 0.25 MG PO TABS: ORAL_TABLET | ORAL | 2 refills | Status: DC

## 2019-09-01 NOTE — Progress Notes (Signed)
[facility] [place], [place] [address]  Internal MEDICINE  Office Visit Note  Patient Name: Jordan Short  [id number]  [id number]  Date of Service: 09/14/2019   Pt is here for routine health maintenance examination  Chief Complaint  Patient presents with  . Annual Exam  . Quality Metric Gaps    tetnaus, Hep C     The patient is here for health maintenance exam. She does see GYN for yearly well woman exams and PAP smears. At her initial visit, she was c/o panic attacks and anxiety. She states that the actual panic attacks happen very infrequently. She states that increased levels of anxiety and stress happen every few days, at least, If not more often. She was prescribed a low dose alprazolam to take on as needed basis. She states that she has only had to take this a few times. She states that it does help to temper her anxiety.  The patient does have frequent palpitations. These will generally happen at night. Will wake up and feels as though her heart is going to stop. Causes her to panic. This is happening once every few weeks or so.     Current Medication: Outpatient Encounter Medications as of 09/01/2019  Medication Sig  . ALPRAZolam (XANAX) 0.25 MG tablet Half to one tab tab as needed for panic attacks  . etonogestrel-ethinyl estradiol (NUVARING) 0.12-0.015 MG/24HR vaginal ring Place 1 each vaginally every 28 (twenty-eight) days. Insert vaginally and leave in place for 3 consecutive weeks, then remove for 1 week.  . [DISCONTINUED] ALPRAZolam (XANAX) 0.25 MG tablet Half to one tab tab as needed for panic attacks   No facility-administered encounter medications on file as of 09/01/2019.    Surgical History: No past surgical history on file.  Medical History: No past medical history on file.  Family History: Family History  Problem Relation Age of Onset  . Hypertension Mother   . Hypertension Father       Review of Systems  Constitutional:  Negative for activity change, chills, fatigue and unexpected weight change.  HENT: Negative for congestion, postnasal drip, rhinorrhea, sneezing and sore throat.   Respiratory: Negative for cough, chest tightness and shortness of breath.   Cardiovascular: Positive for palpitations. Negative for chest pain.  Gastrointestinal: Negative for abdominal pain, constipation, diarrhea, nausea and vomiting.  Endocrine: Negative for cold intolerance, heat intolerance, polydipsia and polyuria.  Genitourinary: Negative for dysuria, frequency and urgency.  Musculoskeletal: Negative for arthralgias, back pain, joint swelling and neck pain.  Skin: Negative for rash.  Allergic/Immunologic: Negative for environmental allergies.  Neurological: Negative for dizziness, tremors, numbness and headaches.  Hematological: Negative for adenopathy. Does not bruise/bleed easily.  Psychiatric/Behavioral: Positive for sleep disturbance. Negative for behavioral problems (Depression) and suicidal ideas. The patient is nervous/anxious.      Today's Vitals   09/01/19 1000  BP: (!) 141/86  Pulse: 92  Resp: 16  Temp: 97.9 F (36.6 C)  SpO2: 99%  Weight: 155 lb 3.2 oz (70.4 kg)  Height: 5' 5" (1.651 m)   Body mass index is 25.83 kg/m.  Physical Exam Vitals and nursing note reviewed.  Constitutional:      General: She is not in acute distress.    Appearance: Normal appearance. She is well-developed. She is not diaphoretic.  HENT:     Head: Normocephalic and atraumatic.     Nose: Nose normal.     Mouth/Throat:     Pharynx: No oropharyngeal exudate.  Eyes:  Pupils: Pupils are equal, round, and reactive to light.  Neck:     Thyroid: No thyromegaly.     Vascular: No JVD.     Trachea: No tracheal deviation.  Cardiovascular:     Rate and Rhythm: Normal rate and regular rhythm.     Pulses: Normal pulses.     Heart sounds: Normal heart sounds. No murmur heard.  No friction rub. No gallop.   Pulmonary:      Effort: Pulmonary effort is normal. No respiratory distress.     Breath sounds: Normal breath sounds. No wheezing or rales.  Chest:     Chest wall: No tenderness.  Abdominal:     General: Bowel sounds are normal.     Palpations: Abdomen is soft.     Tenderness: There is no abdominal tenderness.  Musculoskeletal:        General: Normal range of motion.     Cervical back: Normal range of motion and neck supple.  Lymphadenopathy:     Cervical: No cervical adenopathy.  Skin:    General: Skin is warm and dry.  Neurological:     General: No focal deficit present.     Mental Status: She is alert and oriented to person, place, and time.     Cranial Nerves: No cranial nerve deficit.  Psychiatric:        Attention and Perception: Attention and perception normal.        Mood and Affect: Affect normal. Mood is anxious.        Speech: Speech normal.        Behavior: Behavior normal. Behavior is cooperative.        Thought Content: Thought content normal.        Cognition and Memory: Cognition and memory normal.        Judgment: Judgment normal.     LABS: Recent Results (from the past 2160 hour(s))  CBC with Differential/Platelet     Status: None   Collection Time: 08/19/19  9:16 AM  Result Value Ref Range   WBC CANCELED x10E3/uL    Comment: [facility] was unable to collect sufficient specimen to perform the following test(s), and is providing the patient with re-collection instructions.  Result canceled by the ancillary.    RBC CANCELED     Comment: Test not performed  Result canceled by the ancillary.    Hemoglobin CANCELED     Comment: Test not performed  Result canceled by the ancillary.    Hematocrit CANCELED     Comment: Test not performed  Result canceled by the ancillary.    Platelets CANCELED     Comment: Test not performed  Result canceled by the ancillary.    Neutrophils CANCELED     Comment: Test not performed  Result canceled by the ancillary.    Lymphs  CANCELED     Comment: Test not performed  Result canceled by the ancillary.    Monocytes CANCELED     Comment: Test not performed  Result canceled by the ancillary.    Eos CANCELED     Comment: Test not performed  Result canceled by the ancillary.    Lymphocytes Absolute CANCELED     Comment: Test not performed  Result canceled by the ancillary.    EOS (ABSOLUTE) CANCELED     Comment: Test not performed  Result canceled by the ancillary.    Basophils Absolute CANCELED     Comment: Test not performed  Result canceled by the ancillary.  Lipid Panel With LDL/HDL Ratio     Status: None   Collection Time: 08/19/19  9:16 AM  Result Value Ref Range   Cholesterol, Total 169 100 - 199 mg/dL   Triglycerides 59 0 - 149 mg/dL   HDL 77 >39 mg/dL   VLDL Cholesterol Cal 12 5 - 40 mg/dL   LDL Chol Calc (NIH) 80 0 - 99 mg/dL   LDL/HDL Ratio 1.0 0.0 - 3.2 ratio    Comment:                                     LDL/HDL Ratio                                             Men  Women                               1/2 Avg.Risk  1.0    1.5                                   Avg.Risk  3.6    3.2                                2X Avg.Risk  6.2    5.0                                3X Avg.Risk  8.0    6.1   TSH     Status: None   Collection Time: 08/19/19  9:16 AM  Result Value Ref Range   TSH 3.150 0.450 - 4.500 uIU/mL  T4, free     Status: None   Collection Time: 08/19/19  9:16 AM  Result Value Ref Range   Free T4 1.33 0.82 - 1.77 ng/dL  Comprehensive metabolic panel     Status: Abnormal   Collection Time: 08/19/19  9:16 AM  Result Value Ref Range   Glucose 103 (H) 65 - 99 mg/dL   BUN 6 6 - 20 mg/dL   Creatinine, Ser 0.88 0.57 - 1.00 mg/dL   GFR calc non Af Amer 88 >59 mL/min/1.73   GFR calc Af Amer 101 >59 mL/min/1.73    Comment: **[facility] currently reports eGFR in compliance with the current**   recommendations of the [employer]. [facility] will   update reporting as  new guidelines are published from the NKF-ASN   Task force.    BUN/Creatinine Ratio 7 (L) 9 - 23   Sodium 140 134 - 144 mmol/L   Potassium 4.0 3.5 - 5.2 mmol/L   Chloride 104 96 - 106 mmol/L   CO2 17 (L) 20 - 29 mmol/L   Calcium 9.2 8.7 - 10.2 mg/dL   Total Protein 7.1 6.0 - 8.5 g/dL   Albumin 4.7 3.8 - 4.8 g/dL   Globulin, Total 2.4 1.5 - 4.5 g/dL   Albumin/Globulin Ratio 2.0 1.2 - 2.2   Bilirubin Total 0.2 0.0 - 1.2 mg/dL   Alkaline Phosphatase 88 48 - 121 IU/L   AST 12 0 -  40 IU/L   ALT 12 0 - 32 IU/L  Vitamin D (25 hydroxy)     Status: None   Collection Time: 08/19/19  9:16 AM  Result Value Ref Range   Vit D, 25-Hydroxy 47.0 30.0 - 100.0 ng/mL    Comment: Vitamin D deficiency has been defined by the [employer] practice guideline as a level of serum 25-OH vitamin D less than 20 ng/mL (1,2). The Endocrine Society went on to further define vitamin D insufficiency as a level between 21 and 29 ng/mL (2). 1. IOM (Institute of Medicine). 2010. Dietary reference    intakes for calcium and D. [place]: The    [employer]. 2. Holick MF, Binkley [place], Bischoff-Ferrari HA, et al.    Evaluation, treatment, and prevention of vitamin D    deficiency: an Endocrine Society clinical practice    guideline. JCEM. 2011 Jul; 96(7):1911-30.   B12 and Folate Panel     Status: None   Collection Time: 08/19/19  9:16 AM  Result Value Ref Range   Vitamin B-12 300 232 - 1,245 pg/mL   Folate 13.5 >3.0 ng/mL    Comment: A serum folate concentration of less than 3.1 ng/mL is considered to represent clinical deficiency.   Fe+TIBC+Fer     Status: None   Collection Time: 08/19/19  9:16 AM  Result Value Ref Range   Total Iron Binding Capacity 420 250 - 450 ug/dL   UIBC 352 131 - 425 ug/dL   Iron 68 27 - 159 ug/dL   Iron Saturation 16 15 - 55 %   Ferritin 38 15.0 - 150.0 ng/mL  UA/M w/rflx Culture, Routine     Status: None   Collection Time:  09/01/19 12:33 PM   Specimen: Urine   Urine  Result Value Ref Range   Specific Gravity, UA 1.005 1.005 - 1.030   pH, UA 6.5 5.0 - 7.5   Color, UA Yellow Yellow   Appearance Ur Clear Clear   Leukocytes,UA Negative Negative   Protein,UA Negative Negative/Trace   Glucose, UA Negative Negative   Ketones, UA Negative Negative   RBC, UA Negative Negative   Bilirubin, UA Negative Negative   Urobilinogen, Ur 0.2 0.2 - 1.0 mg/dL   Nitrite, UA Negative Negative   Microscopic Examination Comment     Comment: Microscopic follows if indicated.   Microscopic Examination See below:     Comment: Microscopic was indicated and was performed.   Urinalysis Reflex Comment     Comment: This specimen will not reflex to a Urine Culture.  Microscopic Examination     Status: None   Collection Time: 09/01/19 12:33 PM   Urine  Result Value Ref Range   WBC, UA None seen 0 - 5 /hpf   RBC 0-2 0 - 2 /hpf   Epithelial Cells (non renal) 0-10 0 - 10 /hpf   Casts None seen None seen /lpf   Bacteria, UA None seen None seen/Few    Assessment/Plan: 1. Encounter for general adult medical examination with abnormal findings Annual health maintenance exam today. Reviewed labs with the patient. Mildly elevated glucose, but labs otherwise normal. GYN provider sees patient for well woman exams.   2. Shortness of breath Episodes of palpitations and shortness of breath waking patient during the night. Will get echocardiogram. Consider sleep study depending on echo results.  - ECHOCARDIOGRAM COMPLETE; Future  3. Palpitations Episodes of palpitations and shortness of breath waking patient during the night. Will  get echocardiogram. Consider sleep study depending on echo results.   4. Anxiety May continue alprazolam 0.37m at bedtime as needed for acute anxieyt. A new prescription was sent to her pharmacy today.  - ALPRAZolam (XANAX) 0.25 MG tablet; Half to one tab tab as needed for panic attacks  Dispense: 30 tablet;  Refill: 2  5. Stress May continue alprazolam 0.252mat bedtime as needed for acute anxieyt. A new prescription was sent to her pharmacy today.  - ALPRAZolam (XANAX) 0.25 MG tablet; Half to one tab tab as needed for panic attacks  Dispense: 30 tablet; Refill: 2  6. Dysuria - UA/M w/rflx Culture, Routine  General Counseling: Jordan Short verbalizes understanding of the findings of todays visit and agrees with plan of treatment. I have discussed any further diagnostic evaluation that may be needed or ordered today. We also reviewed her medications today. she has been encouraged to call the office with any questions or concerns that should arise related to todays visit.    Counseling:  This patient was seen by HeLeretha PolNP Collaboration with Dr FoLavera Guises a part of collaborative care agreement  Orders Placed This Encounter  Procedures  . Microscopic Examination  . UA/M w/rflx Culture, Routine  . ECHOCARDIOGRAM COMPLETE    Meds ordered this encounter  Medications  . ALPRAZolam (XANAX) 0.25 MG tablet    Sig: Half to one tab tab as needed for panic attacks    Dispense:  30 tablet    Refill:  2    Order Specific Question:   Supervising Provider    Answer:   KHLavera Guise1[[id number]]  Total time spent: 30 Minutes  Time spent includes review of chart, medications, test results, and follow up plan with the patient.     FoLavera GuiseMD  Internal Medicine

## 2019-09-02 LAB — UA/M W/RFLX CULTURE, ROUTINE
Bilirubin, UA: NEGATIVE
Glucose, UA: NEGATIVE
Ketones, UA: NEGATIVE
Leukocytes,UA: NEGATIVE
Nitrite, UA: NEGATIVE
Protein,UA: NEGATIVE
RBC, UA: NEGATIVE
Specific Gravity, UA: 1.005 (ref 1.005–1.030)
Urobilinogen, Ur: 0.2 mg/dL (ref 0.2–1.0)
pH, UA: 6.5 (ref 5.0–7.5)

## 2019-09-02 LAB — MICROSCOPIC EXAMINATION
Bacteria, UA: NONE SEEN
Casts: NONE SEEN /lpf
WBC, UA: NONE SEEN /hpf (ref 0–5)

## 2019-09-14 ENCOUNTER — Encounter: Payer: Self-pay | Admitting: Nurse Practitioner

## 2019-09-14 DIAGNOSIS — R002 Palpitations: Secondary | ICD-10-CM | POA: Insufficient documentation

## 2019-09-14 DIAGNOSIS — R0602 Shortness of breath: Secondary | ICD-10-CM | POA: Insufficient documentation

## 2019-09-14 DIAGNOSIS — F439 Reaction to severe stress, unspecified: Secondary | ICD-10-CM | POA: Insufficient documentation

## 2019-09-14 DIAGNOSIS — F419 Anxiety disorder, unspecified: Secondary | ICD-10-CM | POA: Insufficient documentation

## 2019-09-14 DIAGNOSIS — R300 Dysuria: Secondary | ICD-10-CM | POA: Insufficient documentation

## 2019-09-14 DIAGNOSIS — Z0001 Encounter for general adult medical examination with abnormal findings: Secondary | ICD-10-CM | POA: Insufficient documentation

## 2019-09-30 DIAGNOSIS — Z01419 Encounter for gynecological examination (general) (routine) without abnormal findings: Secondary | ICD-10-CM | POA: Diagnosis not present

## 2019-09-30 DIAGNOSIS — Z6826 Body mass index (BMI) 26.0-26.9, adult: Secondary | ICD-10-CM | POA: Diagnosis not present

## 2019-09-30 DIAGNOSIS — Z130 Encounter for screening for diseases of the blood and blood-forming organs and certain disorders involving the immune mechanism: Secondary | ICD-10-CM | POA: Diagnosis not present

## 2019-09-30 DIAGNOSIS — Z7189 Other specified counseling: Secondary | ICD-10-CM | POA: Diagnosis not present

## 2019-09-30 DIAGNOSIS — Z3044 Encounter for surveillance of vaginal ring hormonal contraceptive device: Secondary | ICD-10-CM | POA: Diagnosis not present

## 2019-09-30 DIAGNOSIS — B372 Candidiasis of skin and nail: Secondary | ICD-10-CM | POA: Diagnosis not present

## 2019-10-16 ENCOUNTER — Other Ambulatory Visit: Payer: BC Managed Care – PPO

## 2019-10-20 ENCOUNTER — Ambulatory Visit: Payer: BC Managed Care – PPO | Admitting: Nurse Practitioner

## 2019-10-21 ENCOUNTER — Ambulatory Visit: Payer: BC Managed Care – PPO

## 2019-10-21 ENCOUNTER — Other Ambulatory Visit: Payer: Self-pay

## 2019-10-21 DIAGNOSIS — R0602 Shortness of breath: Secondary | ICD-10-CM

## 2019-10-27 ENCOUNTER — Ambulatory Visit: Payer: BC Managed Care – PPO | Admitting: Nurse Practitioner

## 2019-10-27 ENCOUNTER — Other Ambulatory Visit: Payer: Self-pay

## 2019-10-27 ENCOUNTER — Encounter: Payer: Self-pay | Admitting: Nurse Practitioner

## 2019-10-27 VITALS — BP 153/87 | HR 87 | Temp 97.5°F | Resp 16 | Ht 66.0 in | Wt 157.6 lb

## 2019-10-27 DIAGNOSIS — F419 Anxiety disorder, unspecified: Secondary | ICD-10-CM | POA: Diagnosis not present

## 2019-10-27 DIAGNOSIS — R002 Palpitations: Secondary | ICD-10-CM | POA: Diagnosis not present

## 2019-10-27 DIAGNOSIS — R030 Elevated blood-pressure reading, without diagnosis of hypertension: Secondary | ICD-10-CM | POA: Diagnosis not present

## 2019-10-27 NOTE — Progress Notes (Signed)
[facility] [address] [place], [place] [address]  Internal MEDICINE  Office Visit Note  Patient Name: Jordan Short  [id number]  [id number]  Date of Service: 11/18/2019  Chief Complaint  Patient presents with  . Follow-up  . Quality Metric Gaps    flu,tetnaus  . controlled substance form    The patient is here for follow up visit. She did have echocardiogram due to frequent palpitations. These seem to be related to increased levels of anxiety. Her echo showed normal LVF and trace valvular regurgitation. She states that episodes of panic attacks seem to be decreasing and she is rarely using previously prescribed alprazolam 0.25mg . she denies new symptoms or concerns at this time.       Current Medication: Outpatient Encounter Medications as of 10/27/2019  Medication Sig  . ALPRAZolam (XANAX) 0.25 MG tablet Half to one tab tab as needed for panic attacks  . etonogestrel-ethinyl estradiol (NUVARING) 0.12-0.015 MG/24HR vaginal ring Place 1 each vaginally every 28 (twenty-eight) days. Insert vaginally and leave in place for 3 consecutive weeks, then remove for 1 week.   No facility-administered encounter medications on file as of 10/27/2019.    Surgical History: History reviewed. No pertinent surgical history.  Medical History: History reviewed. No pertinent past medical history.  Family History: Family History  Problem Relation Age of Onset  . Hypertension Mother   . Hypertension Father     Social History   Socioeconomic History  . Marital status: Single    Spouse name: Not on file  . Number of children: Not on file  . Years of education: Not on file  . Highest education level: Not on file  Occupational History  . Not on file  Tobacco Use  . Smoking status: Never Smoker  . Smokeless tobacco: Never Used  Substance and Sexual Activity  . Alcohol use: Yes    Comment: occ  . Drug use: Never  . Sexual activity: Yes    Comment: NuvaRing  Other  Topics Concern  . Not on file  Social History Narrative  . Not on file   Social Determinants of Health   Financial Resource Strain:   . Difficulty of Paying Living Expenses: Not on file  Food Insecurity:   . Worried About Programme researcher, broadcasting/film/video in the Last Year: Not on file  . Ran Out of Food in the Last Year: Not on file  Transportation Needs:   . Lack of Transportation (Medical): Not on file  . Lack of Transportation (Non-Medical): Not on file  Physical Activity:   . Days of Exercise per Week: Not on file  . Minutes of Exercise per Session: Not on file  Stress:   . Feeling of Stress : Not on file  Social Connections:   . Frequency of Communication with Friends and Family: Not on file  . Frequency of Social Gatherings with Friends and Family: Not on file  . Attends Religious Services: Not on file  . Active Member of Clubs or Organizations: Not on file  . Attends Banker Meetings: Not on file  . Marital Status: Not on file  Intimate Partner Violence:   . Fear of Current or Ex-Partner: Not on file  . Emotionally Abused: Not on file  . Physically Abused: Not on file  . Sexually Abused: Not on file      Review of Systems  Constitutional: Positive for fatigue. Negative for activity change, chills and unexpected weight change.  HENT: Negative for congestion, postnasal  drip, rhinorrhea, sneezing and sore throat.   Respiratory: Negative for cough, chest tightness and shortness of breath.   Cardiovascular: Positive for palpitations. Negative for chest pain.       Improving frequency of palpitations.   Gastrointestinal: Negative for abdominal pain, constipation, diarrhea, nausea and vomiting.  Endocrine: Negative for cold intolerance, heat intolerance, polydipsia and polyuria.  Musculoskeletal: Negative for arthralgias, back pain, joint swelling and neck pain.  Skin: Negative for rash.  Allergic/Immunologic: Negative for environmental allergies.  Neurological: Negative  for dizziness, tremors, numbness and headaches.  Hematological: Negative for adenopathy. Does not bruise/bleed easily.  Psychiatric/Behavioral: Positive for sleep disturbance. Negative for behavioral problems (Depression) and suicidal ideas. The patient is nervous/anxious.     Today's Vitals   10/27/19 1612  BP: (!) 153/87  Pulse: 87  Resp: 16  Temp: (!) 97.5 F (36.4 C)  SpO2: 99%  Weight: 157 lb 9.6 oz (71.5 kg)  Height: 5\' 6"  (1.676 m)   Body mass index is 25.44 kg/m.  Physical Exam Vitals and nursing note reviewed.  Constitutional:      General: She is not in acute distress.    Appearance: Normal appearance. She is well-developed. She is not diaphoretic.  HENT:     Head: Normocephalic and atraumatic.     Mouth/Throat:     Pharynx: No oropharyngeal exudate.  Eyes:     Pupils: Pupils are equal, round, and reactive to light.  Neck:     Thyroid: No thyromegaly.     Vascular: No JVD.     Trachea: No tracheal deviation.  Cardiovascular:     Rate and Rhythm: Normal rate and regular rhythm.     Heart sounds: Normal heart sounds. No murmur heard.  No friction rub. No gallop.   Pulmonary:     Effort: Pulmonary effort is normal. No respiratory distress.     Breath sounds: Normal breath sounds. No wheezing or rales.  Chest:     Chest wall: No tenderness.  Abdominal:     Palpations: Abdomen is soft.  Musculoskeletal:        General: Normal range of motion.     Cervical back: Normal range of motion and neck supple.  Lymphadenopathy:     Cervical: No cervical adenopathy.  Skin:    General: Skin is warm and dry.  Neurological:     General: No focal deficit present.     Mental Status: She is alert and oriented to person, place, and time.     Cranial Nerves: No cranial nerve deficit.  Psychiatric:        Mood and Affect: Mood normal.        Behavior: Behavior normal.        Thought Content: Thought content normal.        Judgment: Judgment normal.     Assessment/Plan: 1. Palpitations Reviewed echocardiogram with the patient which showed normal LVEF with trace valvular regurgitation. Will get holter event monitor for further evaluation  - Holter monitor - 48 hour; Future  2. Elevated blood pressure reading Generally stable. Will continue to monitor   3. Anxiety May continue to take previously prescribed alprazolam as needed   General Counseling: Maryclare verbalizes understanding of the findings of todays visit and agrees with plan of treatment. I have discussed any further diagnostic evaluation that may be needed or ordered today. We also reviewed her medications today. she has been encouraged to call the office with any questions or concerns that should arise related to todays  visit.  This patient was seen by Vincent Gros FNP Collaboration with Dr Lyndon Code as a part of collaborative care agreement  Orders Placed This Encounter  Procedures  . Holter monitor - 48 hour    Total time spent: 25 Minutes   Time spent includes review of chart, medications, test results, and follow up plan with the patient.      Dr Lyndon Code Internal medicine

## 2019-10-28 ENCOUNTER — Telehealth: Payer: Self-pay

## 2019-10-28 NOTE — Telephone Encounter (Signed)
Pt scheduled and informed for holter monitor on 10/21 @3pm 

## 2019-10-29 ENCOUNTER — Ambulatory Visit
Admission: RE | Admit: 2019-10-29 | Discharge: 2019-10-29 | Disposition: A | Discharge status: Home / Self Care | Payer: BC Managed Care – PPO | Source: Ambulatory Visit | Attending: Nurse Practitioner | Admitting: Nurse Practitioner

## 2019-10-29 ENCOUNTER — Other Ambulatory Visit: Payer: Self-pay

## 2019-10-29 DIAGNOSIS — R002 Palpitations: Secondary | ICD-10-CM | POA: Diagnosis not present

## 2019-11-01 NOTE — Progress Notes (Signed)
Discussed with patient during visit. Holter event monitor ordered.

## 2019-11-18 DIAGNOSIS — R030 Elevated blood-pressure reading, without diagnosis of hypertension: Secondary | ICD-10-CM | POA: Insufficient documentation

## 2020-07-27 DIAGNOSIS — L818 Other specified disorders of pigmentation: Secondary | ICD-10-CM | POA: Diagnosis not present

## 2020-07-27 DIAGNOSIS — D2271 Melanocytic nevi of right lower limb, including hip: Secondary | ICD-10-CM | POA: Diagnosis not present

## 2020-07-27 DIAGNOSIS — D485 Neoplasm of uncertain behavior of skin: Secondary | ICD-10-CM | POA: Diagnosis not present

## 2020-08-22 DIAGNOSIS — L988 Other specified disorders of the skin and subcutaneous tissue: Secondary | ICD-10-CM | POA: Diagnosis not present

## 2020-08-22 DIAGNOSIS — D485 Neoplasm of uncertain behavior of skin: Secondary | ICD-10-CM | POA: Diagnosis not present

## 2020-09-06 ENCOUNTER — Encounter: Payer: BC Managed Care – PPO | Admitting: Nurse Practitioner

## 2020-09-06 DIAGNOSIS — Z0289 Encounter for other administrative examinations: Secondary | ICD-10-CM

## 2020-11-02 DIAGNOSIS — Z01419 Encounter for gynecological examination (general) (routine) without abnormal findings: Secondary | ICD-10-CM | POA: Diagnosis not present

## 2020-11-02 DIAGNOSIS — Z124 Encounter for screening for malignant neoplasm of cervix: Secondary | ICD-10-CM | POA: Diagnosis not present

## 2020-11-02 DIAGNOSIS — Z130 Encounter for screening for diseases of the blood and blood-forming organs and certain disorders involving the immune mechanism: Secondary | ICD-10-CM | POA: Diagnosis not present

## 2020-11-02 DIAGNOSIS — Z3044 Encounter for surveillance of vaginal ring hormonal contraceptive device: Secondary | ICD-10-CM | POA: Diagnosis not present

## 2020-11-02 DIAGNOSIS — N898 Other specified noninflammatory disorders of vagina: Secondary | ICD-10-CM | POA: Diagnosis not present

## 2020-11-08 LAB — HM PAP SMEAR

## 2021-01-13 ENCOUNTER — Ambulatory Visit (INDEPENDENT_AMBULATORY_CARE_PROVIDER_SITE_OTHER): Payer: BC Managed Care – PPO | Admitting: Nurse Practitioner

## 2021-01-13 ENCOUNTER — Encounter: Payer: Self-pay | Admitting: Nurse Practitioner

## 2021-01-13 ENCOUNTER — Other Ambulatory Visit: Payer: Self-pay

## 2021-01-13 VITALS — BP 117/77 | HR 81 | Temp 98.5°F | Ht 65.0 in | Wt 159.5 lb

## 2021-01-13 DIAGNOSIS — R5383 Other fatigue: Secondary | ICD-10-CM | POA: Diagnosis not present

## 2021-01-13 DIAGNOSIS — F419 Anxiety disorder, unspecified: Secondary | ICD-10-CM

## 2021-01-13 DIAGNOSIS — Z0000 Encounter for general adult medical examination without abnormal findings: Secondary | ICD-10-CM

## 2021-01-13 DIAGNOSIS — E569 Vitamin deficiency, unspecified: Secondary | ICD-10-CM | POA: Diagnosis not present

## 2021-01-13 DIAGNOSIS — Z7689 Persons encountering health services in other specified circumstances: Secondary | ICD-10-CM

## 2021-01-13 DIAGNOSIS — F439 Reaction to severe stress, unspecified: Secondary | ICD-10-CM

## 2021-01-13 DIAGNOSIS — R002 Palpitations: Secondary | ICD-10-CM | POA: Diagnosis not present

## 2021-01-13 DIAGNOSIS — Z6826 Body mass index (BMI) 26.0-26.9, adult: Secondary | ICD-10-CM

## 2021-01-13 DIAGNOSIS — E559 Vitamin D deficiency, unspecified: Secondary | ICD-10-CM | POA: Diagnosis not present

## 2021-01-13 MED ORDER — ALPRAZOLAM 0.25 MG PO TABS: ORAL_TABLET | ORAL | 2 refills | Status: DC

## 2021-01-13 NOTE — Progress Notes (Signed)
New Patient Office Visit  Subjective:  Patient ID: Jordan Short, female    DOB: 12/28/1990  Age: 31 y.o. MRN: [id number]  CC:  Chief Complaint  Patient presents with   New Patient (Initial Visit)    HPI Jordan Short presents to establish new primary care provider. She had been having palpitations in the past. Did have Holter evaluation. This indicated normal sinus rhythm with occasional PACs and PVCs. She states that this happens mostly at night. Will wake up with palpitations and pressure in her chest. She denies shortness of breath with the palpitations. She was given a low dose of alprazolam to help these episodes. She states that it did help, though she did continue to have them. She states that episodes are intermittent. They don't happen every night.  She does see GYN for well woman care.   History reviewed. No pertinent past medical history.  History reviewed. No pertinent surgical history.  Family History  Problem Relation Age of Onset   Hypertension Mother    Hypertension Father    Colon cancer Maternal Grandfather     Social History   Socioeconomic History   Marital status: Single    Spouse name: Not on file   Number of children: Not on file   Years of education: Not on file   Highest education level: Not on file  Occupational History   Not on file  Tobacco Use   Smoking status: Never   Smokeless tobacco: Never  Substance and Sexual Activity   Alcohol use: Never    Comment: occ   Drug use: Never   Sexual activity: Yes    Comment: NuvaRing  Other Topics Concern   Not on file  Social History Narrative   Not on file   Social Determinants of Health   Financial Resource Strain: Not on file  Food Insecurity: Not on file  Transportation Needs: Not on file  Physical Activity: Not on file  Stress: Not on file  Social Connections: Not on file  Intimate Partner Violence: Not on file    ROS Review of Systems  Constitutional:  Negative for activity change,  appetite change, chills, fatigue and fever.  HENT:  Negative for congestion, postnasal drip, rhinorrhea, sinus pressure, sinus pain, sneezing and sore throat.   Eyes: Negative.   Respiratory:  Negative for cough, chest tightness, shortness of breath and wheezing.   Cardiovascular:  Positive for palpitations. Negative for chest pain.  Gastrointestinal:  Negative for abdominal pain, constipation, diarrhea, nausea and vomiting.  Endocrine: Negative for cold intolerance, heat intolerance, polydipsia and polyuria.  Genitourinary:  Negative for dyspareunia, dysuria, flank pain, frequency and urgency.  Musculoskeletal:  Negative for arthralgias, back pain and myalgias.  Skin:  Negative for rash.  Allergic/Immunologic: Negative for environmental allergies.  Neurological:  Negative for dizziness, weakness and headaches.  Hematological:  Negative for adenopathy.  Psychiatric/Behavioral:  Positive for sleep disturbance. The patient is nervous/anxious.    Objective:   Today's Vitals   01/13/21 0933  BP: 117/77  Pulse: 81  Temp: 98.5 F (36.9 C)  SpO2: 97%  Weight: 159 lb 8 oz (72.3 kg)  Height: _0  (1.651 m)   Body mass index is 26.54 kg/m.   Physical Exam Vitals and nursing note reviewed.  Constitutional:      Appearance: Normal appearance. She is well-developed.  HENT:     Head: Normocephalic and atraumatic.     Nose: Nose normal.     Mouth/Throat:     Mouth:  Mucous membranes are moist.     Pharynx: Oropharynx is clear.  Eyes:     Pupils: Pupils are equal, round, and reactive to light.  Cardiovascular:     Rate and Rhythm: Normal rate and regular rhythm.     Pulses: Normal pulses.     Heart sounds: Normal heart sounds.  Pulmonary:     Effort: Pulmonary effort is normal.     Breath sounds: Normal breath sounds.  Abdominal:     Palpations: Abdomen is soft.  Musculoskeletal:        General: Normal range of motion.     Cervical back: Normal range of motion and neck supple.   Lymphadenopathy:     Cervical: No cervical adenopathy.  Skin:    General: Skin is warm and dry.     Capillary Refill: Capillary refill takes less than 2 seconds.  Neurological:     General: No focal deficit present.     Mental Status: She is alert and oriented to person, place, and time.  Psychiatric:        Mood and Affect: Mood normal.        Behavior: Behavior normal.        Thought Content: Thought content normal.        Judgment: Judgment normal.    Assessment & Plan:  1. Encounter to establish care Appointment today to establish new primary care provider    2. Palpitation Likely related to stress and anxiety.  Unable to review prior Holter event monitor.  She did have normal sinus rhythm with occasional PVCs and PACs.  We will check labs including thyroid panel, magnesium, phosphorus, fasting lipids, and other routine fasting labs. - CBC with Differential/Platelet; Future - T4, free; Future - Lipid panel; Future - Hemoglobin A1c; Future - Comp Met (CMET); Future - TSH; Future - Magnesium; Future - Vitamin D (25 hydroxy); Future - Magnesium - TSH - Comp Met (CMET) - Hemoglobin A1c - Lipid panel - T4, free - CBC with Differential/Platelet - Vitamin D (25 hydroxy)  3. Other fatigue Check routine, fasting labs with magnesium, thyroid panel, vitamin D, and hemoglobin A1c. - CBC with Differential/Platelet; Future - T4, free; Future - Lipid panel; Future - Hemoglobin A1c; Future - Comp Met (CMET); Future - TSH; Future - Magnesium; Future - Vitamin D (25 hydroxy); Future - Magnesium - TSH - Comp Met (CMET) - Hemoglobin A1c - Lipid panel - T4, free - CBC with Differential/Platelet - Vitamin D (25 hydroxy)  4. Vitamin D deficiency Check vitamin D level and treat deficiency as indicated. - Vitamin D (25 hydroxy); Future - Vitamin D (25 hydroxy)  5. Body mass index 26.0-26.9, adult Discussed lowering calorie intake to 1500 calories per day and incorporating  exercise into daily routine to help lose weight.   6. Stress May take alprazolam 0.25 mg 1 time daily if needed for acute anxiety/panic attack.  Single, 30-day prescription sent to her pharmacy. - ALPRAZolam (XANAX) 0.25 MG tablet; Half to one tab tab as needed for panic attacks  Dispense: 30 tablet; Refill: 2  7. Healthcare maintenance Routine, fasting labs drawn during today's visit. - CBC with Differential/Platelet; Future - T4, free; Future - Lipid panel; Future - Hemoglobin A1c; Future - Comp Met (CMET); Future - TSH; Future - Magnesium; Future - Vitamin D (25 hydroxy); Future - Magnesium - TSH - Comp Met (CMET) - Hemoglobin A1c - Lipid panel - T4, free - CBC with Differential/Platelet - Vitamin D (25 hydroxy)  Problem List Items Addressed This Visit       Other   Palpitation   Relevant Orders   CBC with Differential/Platelet (Completed)   T4, free (Completed)   Lipid panel (Completed)   Hemoglobin A1c (Completed)   Comp Met (CMET) (Completed)   TSH (Completed)   Magnesium (Completed)   Vitamin D (25 hydroxy) (Completed)   Stress   Relevant Medications   ALPRAZolam (XANAX) 0.25 MG tablet   Other fatigue   Relevant Orders   CBC with Differential/Platelet (Completed)   T4, free (Completed)   Lipid panel (Completed)   Hemoglobin A1c (Completed)   Comp Met (CMET) (Completed)   TSH (Completed)   Magnesium (Completed)   Vitamin D (25 hydroxy) (Completed)   Vitamin D deficiency   Relevant Orders   Vitamin D (25 hydroxy) (Completed)   Body mass index 26.0-26.9, adult   Other Visit Diagnoses     Encounter to establish care    -  Primary   Healthcare maintenance       Relevant Orders   CBC with Differential/Platelet (Completed)   T4, free (Completed)   Lipid panel (Completed)   Hemoglobin A1c (Completed)   Comp Met (CMET) (Completed)   TSH (Completed)   Magnesium (Completed)   Vitamin D (25 hydroxy) (Completed)       Outpatient Encounter  Medications as of 01/13/2021  Medication Sig   etonogestrel-ethinyl estradiol (NUVARING) 0.12-0.015 MG/24HR vaginal ring Place 1 each vaginally every 28 (twenty-eight) days. Insert vaginally and leave in place for 3 consecutive weeks, then remove for 1 week.   ALPRAZolam (XANAX) 0.25 MG tablet Half to one tab tab as needed for panic attacks   [DISCONTINUED] ALPRAZolam (XANAX) 0.25 MG tablet Half to one tab tab as needed for panic attacks (Patient not taking: Reported on 01/13/2021)   No facility-administered encounter medications on file as of 01/13/2021.    Follow-up: Return in about 4 weeks (around 02/10/2021) for health maintenance exam.   Ronnell Freshwater, NP  This note was dictated using Dragon Voice Recognition Software. Rapid proofreading was performed to expedite the delivery of the information. Despite proofreading, phonetic errors will occur which are common with this voice recognition software. Please take this into consideration. If there are any concerns, please contact our office.

## 2021-01-14 LAB — COMPREHENSIVE METABOLIC PANEL
ALT: 11 IU/L (ref 0–32)
AST: 14 IU/L (ref 0–40)
Albumin/Globulin Ratio: 1.9 (ref 1.2–2.2)
Albumin: 4.4 g/dL (ref 3.8–4.8)
Alkaline Phosphatase: 93 IU/L (ref 44–121)
BUN/Creatinine Ratio: 11 (ref 9–23)
BUN: 9 mg/dL (ref 6–20)
Bilirubin Total: 0.3 mg/dL (ref 0.0–1.2)
CO2: 20 mmol/L (ref 20–29)
Calcium: 8.8 mg/dL (ref 8.7–10.2)
Chloride: 106 mmol/L (ref 96–106)
Creatinine, Ser: 0.79 mg/dL (ref 0.57–1.00)
Globulin, Total: 2.3 g/dL (ref 1.5–4.5)
Glucose: 93 mg/dL (ref 70–99)
Potassium: 4.1 mmol/L (ref 3.5–5.2)
Sodium: 141 mmol/L (ref 134–144)
Total Protein: 6.7 g/dL (ref 6.0–8.5)
eGFR: 102 mL/min/{1.73_m2} (ref >59)

## 2021-01-14 LAB — CBC WITH DIFFERENTIAL/PLATELET
Basophils Absolute: 0 10*3/uL (ref 0.0–0.2)
Basos: 1 %
EOS (ABSOLUTE): 0.2 10*3/uL (ref 0.0–0.4)
Eos: 4 %
Hematocrit: 37.3 % (ref 34.0–46.6)
Hemoglobin: 12.8 g/dL (ref 11.1–15.9)
Immature Grans (Abs): 0 10*3/uL (ref 0.0–0.1)
Immature Granulocytes: 0 %
Lymphocytes Absolute: 1.5 10*3/uL (ref 0.7–3.1)
Lymphs: 40 %
MCH: 31.7 pg (ref 26.6–33.0)
MCHC: 34.3 g/dL (ref 31.5–35.7)
MCV: 92 fL (ref 79–97)
Monocytes Absolute: 0.3 10*3/uL (ref 0.1–0.9)
Monocytes: 7 %
Neutrophils Absolute: 1.8 10*3/uL (ref 1.4–7.0)
Neutrophils: 48 %
Platelets: 188 10*3/uL (ref 150–450)
RBC: 4.04 x10E6/uL (ref 3.77–5.28)
RDW: 12.1 % (ref 11.7–15.4)
WBC: 3.8 10*3/uL (ref 3.4–10.8)

## 2021-01-14 LAB — LIPID PANEL
Chol/HDL Ratio: 2.2 ratio (ref 0.0–4.4)
Cholesterol, Total: 163 mg/dL (ref 100–199)
HDL: 74 mg/dL (ref >39)
LDL Chol Calc (NIH): 79 mg/dL (ref 0–99)
Triglycerides: 49 mg/dL (ref 0–149)
VLDL Cholesterol Cal: 10 mg/dL (ref 5–40)

## 2021-01-14 LAB — TSH: TSH: 2.19 u[IU]/mL (ref 0.450–4.500)

## 2021-01-14 LAB — VITAMIN D 25 HYDROXY (VIT D DEFICIENCY, FRACTURES): Vit D, 25-Hydroxy: 33.1 ng/mL (ref 30.0–100.0)

## 2021-01-14 LAB — MAGNESIUM: Magnesium: 2 mg/dL (ref 1.6–2.3)

## 2021-01-14 LAB — T4, FREE: Free T4: 1.36 ng/dL (ref 0.82–1.77)

## 2021-01-14 LAB — HEMOGLOBIN A1C
Est. average glucose Bld gHb Est-mCnc: 94 mg/dL
Hgb A1c MFr Bld: 4.9 % (ref 4.8–5.6)

## 2021-01-15 DIAGNOSIS — Z6826 Body mass index (BMI) 26.0-26.9, adult: Secondary | ICD-10-CM | POA: Insufficient documentation

## 2021-01-15 DIAGNOSIS — E559 Vitamin D deficiency, unspecified: Secondary | ICD-10-CM | POA: Insufficient documentation

## 2021-01-15 DIAGNOSIS — R5383 Other fatigue: Secondary | ICD-10-CM | POA: Insufficient documentation

## 2021-01-16 ENCOUNTER — Encounter: Payer: Self-pay | Admitting: Nurse Practitioner

## 2021-01-16 NOTE — Progress Notes (Signed)
Normal labs. MyChart message sent to patent.

## 2021-02-14 DIAGNOSIS — H53143 Visual discomfort, bilateral: Secondary | ICD-10-CM | POA: Diagnosis not present

## 2022-03-25 ENCOUNTER — Encounter (HOSPITAL_COMMUNITY): Payer: Self-pay | Admitting: *Deleted

## 2022-03-25 ENCOUNTER — Emergency Department (HOSPITAL_COMMUNITY)
Admission: EM | Admit: 2022-03-25 | Discharge: 2022-03-25 | Disposition: A | Discharge status: Home / Self Care | Payer: 59 | Attending: Emergency Medicine | Admitting: Emergency Medicine

## 2022-03-25 ENCOUNTER — Other Ambulatory Visit: Payer: Self-pay

## 2022-03-25 DIAGNOSIS — R197 Diarrhea, unspecified: Secondary | ICD-10-CM | POA: Diagnosis present

## 2022-03-25 DIAGNOSIS — E876 Hypokalemia: Secondary | ICD-10-CM | POA: Insufficient documentation

## 2022-03-25 DIAGNOSIS — K529 Noninfective gastroenteritis and colitis, unspecified: Secondary | ICD-10-CM | POA: Insufficient documentation

## 2022-03-25 DIAGNOSIS — E860 Dehydration: Secondary | ICD-10-CM | POA: Insufficient documentation

## 2022-03-25 DIAGNOSIS — R000 Tachycardia, unspecified: Secondary | ICD-10-CM | POA: Diagnosis not present

## 2022-03-25 LAB — CBC WITH DIFFERENTIAL/PLATELET
Abs Immature Granulocytes: 0.01 10*3/uL (ref 0.00–0.07)
Basophils Absolute: 0 10*3/uL (ref 0.0–0.1)
Basophils Relative: 0 %
Eosinophils Absolute: 0.1 10*3/uL (ref 0.0–0.5)
Eosinophils Relative: 1 %
HCT: 44.7 % (ref 36.0–46.0)
Hemoglobin: 14.9 g/dL (ref 12.0–15.0)
Immature Granulocytes: 0 %
Lymphocytes Relative: 6 %
Lymphs Abs: 0.4 10*3/uL — ABNORMAL LOW (ref 0.7–4.0)
MCH: 31.8 pg (ref 26.0–34.0)
MCHC: 33.3 g/dL (ref 30.0–36.0)
MCV: 95.5 fL (ref 80.0–100.0)
Monocytes Absolute: 0.7 10*3/uL (ref 0.1–1.0)
Monocytes Relative: 9 %
Neutro Abs: 6.3 10*3/uL (ref 1.7–7.7)
Neutrophils Relative %: 84 %
Platelets: 187 10*3/uL (ref 150–400)
RBC: 4.68 MIL/uL (ref 3.87–5.11)
RDW: 12.4 % (ref 11.5–15.5)
WBC: 7.5 10*3/uL (ref 4.0–10.5)
nRBC: 0 % (ref 0.0–0.2)

## 2022-03-25 LAB — COMPREHENSIVE METABOLIC PANEL
ALT: 10 U/L (ref 0–44)
AST: 25 U/L (ref 15–41)
Albumin: 3 g/dL — ABNORMAL LOW (ref 3.5–5.0)
Alkaline Phosphatase: 53 U/L (ref 38–126)
Anion gap: 14 (ref 5–15)
BUN: 8 mg/dL (ref 6–20)
CO2: 12 mmol/L — ABNORMAL LOW (ref 22–32)
Calcium: 6.9 mg/dL — ABNORMAL LOW (ref 8.9–10.3)
Chloride: 116 mmol/L — ABNORMAL HIGH (ref 98–111)
Creatinine, Ser: 0.74 mg/dL (ref 0.44–1.00)
GFR, Estimated: >60 mL/min (ref >60)
Glucose, Bld: 98 mg/dL (ref 70–99)
Potassium: 3.1 mmol/L — ABNORMAL LOW (ref 3.5–5.1)
Sodium: 142 mmol/L (ref 135–145)
Total Bilirubin: 0.8 mg/dL (ref 0.3–1.2)
Total Protein: 5 g/dL — ABNORMAL LOW (ref 6.5–8.1)

## 2022-03-25 LAB — C DIFFICILE QUICK SCREEN W PCR REFLEX
C Diff antigen: NEGATIVE
C Diff interpretation: NOT DETECTED
C Diff toxin: NEGATIVE

## 2022-03-25 LAB — LIPASE, BLOOD: Lipase: 42 U/L (ref 11–51)

## 2022-03-25 MED ORDER — DICYCLOMINE HCL 20 MG PO TABS: 20.0000 mg | ORAL_TABLET | Freq: Three times a day (TID) | ORAL | 0 refills | Status: DC | PRN

## 2022-03-25 MED ORDER — DICYCLOMINE HCL 10 MG/ML IM SOLN
20.0000 mg | Freq: Once | INTRAMUSCULAR | Status: AC
  Administered 2022-03-25: 20 mg via INTRAMUSCULAR
  Filled 2022-03-25: qty 2

## 2022-03-25 MED ORDER — ONDANSETRON HCL 4 MG PO TABS: 4.0000 mg | ORAL_TABLET | Freq: Three times a day (TID) | ORAL | 0 refills | Status: AC | PRN

## 2022-03-25 MED ORDER — METOCLOPRAMIDE HCL 5 MG/ML IJ SOLN
10.0000 mg | Freq: Once | INTRAMUSCULAR | Status: AC
  Administered 2022-03-25: 10 mg via INTRAVENOUS
  Filled 2022-03-25: qty 2

## 2022-03-25 MED ORDER — POTASSIUM CHLORIDE CRYS ER 20 MEQ PO TBCR
40.0000 meq | EXTENDED_RELEASE_TABLET | Freq: Once | ORAL | Status: AC
  Administered 2022-03-25: 40 meq via ORAL
  Filled 2022-03-25: qty 2

## 2022-03-25 MED ORDER — LACTATED RINGERS IV BOLUS
1000.0000 mL | Freq: Once | INTRAVENOUS | Status: AC
  Administered 2022-03-25: 1000 mL via INTRAVENOUS

## 2022-03-25 MED ORDER — DIPHENHYDRAMINE HCL 50 MG/ML IJ SOLN
25.0000 mg | Freq: Once | INTRAMUSCULAR | Status: AC
  Administered 2022-03-25: 25 mg via INTRAVENOUS
  Filled 2022-03-25: qty 1

## 2022-03-25 NOTE — Discharge Instructions (Addendum)
Thank you for letting us take care of you today.  Overall, the workup we have back thus far is reassuring.  Your symptoms are most likely related to gastroenteritis which is usually caused by a virus.  Your potassium is mildly low but you do not have any severe electrolyte disturbances, no signs of problems with your kidneys and your white blood cell count was normal.  We were able to control your symptoms with nausea medication.  I am prescribing a nausea medication for you to take at home in addition to a medication to help with abdominal cramping.  Please take these as prescribed.  If you are experiencing significant nausea, I recommend that you take the nausea medication and then wait approximately 30 minutes before trying to eat or drink.  I also provided some information regarding diet choices to help with diarrhea.  It is important to remain well-hydrated while you are having the symptoms.  I recommend using electrolyte replacement solutions at home such as Gatorade, Powerade, or Pedialyte in addition to water.  As discussed, all of the results from the stool testing will not return during your visit today.  Your test for C. difficile was negative.  Should any of the other stool test come back positive, you will be contacted with these results or may follow-up via MyChart.  If you develop any new or worsening symptoms like this we discussed such as severe abdominal pain, inability to eat or drink using the medications provided, bloody diarrhea or vomit, stop urinating, or other concerning symptoms, please return to the nearest emergency department for reevaluation.

## 2022-03-25 NOTE — ED Notes (Signed)
IV team at bedside to get an ultrasound IV.

## 2022-03-25 NOTE — ED Triage Notes (Signed)
Pt here via [facility] for acute onset vomiting and diarrhea this afternoon.  Pt cannot control her bowel movements.  States ate a few bites of [place] food last night (as did her family) and drank 3 beers.  Denies drug use or any sick contacts.  HR 110 CBG 166  Given 4 mg zofran and 500 ns  PT ao x 4

## 2022-03-25 NOTE — ED Notes (Signed)
Pt given water per provider verbal order.

## 2022-03-25 NOTE — ED Notes (Signed)
Assumed care of pt

## 2022-03-25 NOTE — ED Provider Notes (Signed)
[facility] Provider Note   CSN: [id number] Arrival date & time: 03/25/22  1735     History  Chief Complaint  Patient presents with   Diarrhea    Jordan Short is a 32 y.o. female with no past medical history who presents to the ED complaining of diarrhea onset today.  She notes that she has had multiple episodes of extremely watery stools today.  She denies any bloody stools.  She states she has also had a couple episodes of associated nonbloody emesis.  She has mild diffuse abdominal cramping but no severe abdominal pain.  No known sick contacts.  States that she had [place] food to eat last night as well as a few beers but no identifiable known spoiled foods.  No recent antibiotics.  No recent travel.  Prior to arrival, she received 4 mg Zofran and 500 mL normal saline.  She denies chest pain, shortness of breath, fever, chills, dysuria, hematuria, or other complaints at this time.  No history of the symptoms. She denies chance of pregnancy.     Home Medications No daily prescription medications  Allergies    Patient has no known allergies.    Review of Systems   Review of Systems  All other systems reviewed and are negative.   Physical Exam Updated Vital Signs BP 111/84   Pulse 84   Temp 97.6 F (36.4 C) (Oral)   Resp (!) 21   Ht 5\' 5"  (1.651 m)   Wt 72.3 kg   LMP 02/11/2022   SpO2 99%   BMI 26.52 kg/m  Physical Exam Vitals and nursing note reviewed.  Constitutional:      General: She is in acute distress (mild).     Appearance: Normal appearance. She is ill-appearing.  HENT:     Head: Normocephalic and atraumatic.     Mouth/Throat:     Mouth: Mucous membranes are dry.  Eyes:     General: No scleral icterus.    Conjunctiva/sclera: Conjunctivae normal.  Cardiovascular:     Rate and Rhythm: Regular rhythm. Tachycardia present.     Heart sounds: No murmur heard. Pulmonary:     Effort: Pulmonary effort is normal.      Breath sounds: Normal breath sounds.  Abdominal:     General: Abdomen is flat. There is no distension.     Palpations: Abdomen is soft.     Tenderness: There is no abdominal tenderness. There is no right CVA tenderness, left CVA tenderness, guarding or rebound.  Musculoskeletal:        General: Normal range of motion.     Cervical back: Normal range of motion and neck supple.     Right lower leg: No edema.     Left lower leg: No edema.  Skin:    General: Skin is warm and dry.     Capillary Refill: Capillary refill takes 2 to 3 seconds.     Coloration: Skin is not jaundiced or pale.  Neurological:     General: No focal deficit present.     Mental Status: She is alert and oriented to person, place, and time.  Psychiatric:        Mood and Affect: Mood normal.        Behavior: Behavior normal.     ED Results / Procedures / Treatments   Labs (all labs ordered are listed, but only abnormal results are displayed) Labs Reviewed  COMPREHENSIVE METABOLIC PANEL - Abnormal; Notable for the  following components:      Result Value   Potassium 3.1 (*)    Chloride 116 (*)    CO2 12 (*)    Calcium 6.9 (*)    Total Protein 5.0 (*)    Albumin 3.0 (*)    All other components within normal limits  CBC WITH DIFFERENTIAL/PLATELET - Abnormal; Notable for the following components:   Lymphs Abs 0.4 (*)    All other components within normal limits  C DIFFICILE QUICK SCREEN W PCR REFLEX    GASTROINTESTINAL PANEL BY PCR, STOOL (REPLACES STOOL CULTURE)  LIPASE, BLOOD  CBC WITH DIFFERENTIAL/PLATELET  URINALYSIS, ROUTINE W REFLEX MICROSCOPIC  HCG, QUANTITATIVE, PREGNANCY    EKG EKG Interpretation  Date/Time:  Sunday March 25 2022 19:20:09 EDT Ventricular Rate:  118 PR Interval:  129 QRS Duration: 91 QT Interval:  330 QTC Calculation: 463 R Axis:   97 Text Interpretation: Sinus tachycardia Right atrial enlargement Confirmed by Godfrey Pick ([id number]) on 03/25/2022 7:22:10 PM  Radiology No  results found.  Procedures Procedures    Medications Ordered in ED Medications  potassium chloride SA (KLOR-CON M) CR tablet 40 mEq (has no administration in time range)  lactated ringers bolus 1,000 mL (1,000 mLs Intravenous New Bag/Given 03/25/22 1820)  dicyclomine (BENTYL) injection 20 mg (20 mg Intramuscular Given 03/25/22 1822)  diphenhydrAMINE (BENADRYL) injection 25 mg (25 mg Intravenous Given 03/25/22 1826)  metoCLOPramide (REGLAN) injection 10 mg (10 mg Intravenous Given 03/25/22 1824)    ED Course/ Medical Decision Making/ A&P                             Medical Decision Making Amount and/or Complexity of Data Reviewed Labs: ordered. Decision-making details documented in ED Course.  Risk Prescription drug management.   Medical Decision Making:   Jordan Short is a 32 y.o. female who presented to the ED today with vomiting and diarrhea detailed above.    Additional history discussed with patient's family/caregivers.  Patient's presentation is complicated by their history of possible food poisoning.  Patient placed on continuous vitals and telemetry monitoring while in ED which was reviewed periodically.  Complete initial physical exam performed, notably the patient  was in mild acute distress secondary to discomfort, actively having watery stools, abdomen was soft and nontender, she did have dry mucous membranes with slightly delayed capillary refill and appeared clinically dehydrated.  She was neurologically intact.  Remainder of exam is benign. Reviewed and confirmed nursing documentation for past medical history, family history, social history.    Initial Assessment:   With the patient's presentation of vomiting and diarrhea, most likely diagnosis is gastroenteritis. Differential diagnosis includes but is not limited to viral syndrome, bacterial infection, parasitic infection, electrolyte disturbance, acute kidney injury, dehydration, ACS, acute abdomen, GI bleed, food  poisoning, C difficile colitis.  This is most consistent with an acute complicated illness  Initial Plan:  Screening labs including CBC and Metabolic panel to evaluate for infectious or metabolic etiology of disease.  Lipase to evaluate for pancreatitis Urinalysis with reflex culture ordered to evaluate for UTI or relevant urologic/nephrologic pathology.  Stool samples sent for testing EKG to evaluate for cardiac pathology IV fluids for hydration and symptomatic management Objective evaluation as reviewed   Initial Study Results:   Laboratory  All laboratory results reviewed without evidence of clinically relevant pathology.   Exceptions include: K3.1, albumin 3.0, total protein 5.0  EKG EKG was reviewed independently. ST segments  without concerns for elevations.   EKG: sinus tachycardia.   Final Assessment and Plan:   This is a 32 year old female who presents to the ED with severe watery diarrhea that began today in addition to a couple episodes of vomiting.  Patient notes having [place] food last night in addition to a small amount of alcohol but no known spoiled foods or sick contacts.  She has mild associated abdominal cramping but no severe abdominal pain.  On initial exam, patient is in mild acute distress secondary to discomfort and is actively having a very loose, watery bowel movement.  Abdomen soft, nontender, and without rebound, guarding, or peritoneal signs or distention.  She was initially tachycardic but afebrile and remainder of vital signs were unremarkable.  She did have dry mucous membranes and a delayed capillary refill and appear clinically dehydrated.  Workup obtained as above for further assessment in addition to symptomatic management and IV hydration.  Following medications administered above, patient had good nausea relief.  After these medications, she had 1 more small episode of diarrhea.  She reports significant relief in her symptoms and appears much more  comfortable on multiple reassessments.  Unfortunately, first IV that was placed did not appear to be working properly and thus IV needed to be relocated in order to administer IV fluids.  Patient tolerated this well.  On repeat examinations, abdomen remains soft and nontender.  Overall, workup reassuring.  Patient does have a mild hypokalemia at 3.1.  Suspect that this is secondary to volume loss in the setting of her vomiting and diarrhea.  Remainder of her electrolytes unremarkable.  No acute kidney injury.  No leukocytosis.  Patient was rapid improvement during ED stay.  Tachycardia resolved after IV fluids.  C. difficile test negative.  Other stool testing sent to lab but has not yet resulted.  With patient's significant improvement, discussed all findings with her and partner at bedside.  She is currently tolerating p.o. and is not having any more active vomiting or diarrhea.  With this, I believe it is reasonable to discharge her home at this time with medications to manage her symptoms and recommendation for good hydration at home.  Patient expressed understanding of this.  Given information regarding diet choices at home to help with diarrhea.  Discussed strict ED return precautions.  Patient states that she will return if needed.  Aware that she can follow-up on stool testing via MyChart which she is familiar with.  All questions answered and stable for discharge.   Clinical Impression:  1. Gastroenteritis   2. Dehydration   3. Hypokalemia      Discharge           Final Clinical Impression(s) / ED Diagnoses Final diagnoses:  Gastroenteritis  Dehydration  Hypokalemia    Rx / DC Orders ED Discharge Orders          Ordered    ondansetron (ZOFRAN) 4 MG tablet  Every 8 hours PRN        03/25/22 2111    dicyclomine (BENTYL) 20 MG tablet  3 times daily PRN        03/25/22 2111              Turner Daniels 03/25/22 2123    Godfrey Pick, MD 03/27/22 0246

## 2022-03-26 LAB — GASTROINTESTINAL PANEL BY PCR, STOOL (REPLACES STOOL CULTURE)

## 2022-05-10 ENCOUNTER — Other Ambulatory Visit: Payer: Self-pay | Admitting: Gynecology

## 2022-05-10 DIAGNOSIS — N6311 Unspecified lump in the right breast, upper outer quadrant: Secondary | ICD-10-CM

## 2022-05-23 ENCOUNTER — Ambulatory Visit
Admission: RE | Admit: 2022-05-23 | Discharge: 2022-05-23 | Disposition: A | Discharge status: Home / Self Care | Payer: 59 | Source: Ambulatory Visit | Attending: Gynecology | Admitting: Gynecology

## 2022-05-23 DIAGNOSIS — N6311 Unspecified lump in the right breast, upper outer quadrant: Secondary | ICD-10-CM

## 2022-05-23 LAB — HM MAMMOGRAPHY

## 2022-06-01 ENCOUNTER — Encounter: Payer: Self-pay | Admitting: Nurse Practitioner

## 2022-06-01 ENCOUNTER — Ambulatory Visit (INDEPENDENT_AMBULATORY_CARE_PROVIDER_SITE_OTHER): Payer: 59 | Admitting: Nurse Practitioner

## 2022-06-01 VITALS — BP 110/76 | HR 99 | Ht 65.0 in | Wt 155.0 lb

## 2022-06-01 DIAGNOSIS — R5383 Other fatigue: Secondary | ICD-10-CM

## 2022-06-01 DIAGNOSIS — E559 Vitamin D deficiency, unspecified: Secondary | ICD-10-CM

## 2022-06-01 DIAGNOSIS — F419 Anxiety disorder, unspecified: Secondary | ICD-10-CM

## 2022-06-01 DIAGNOSIS — Z0001 Encounter for general adult medical examination with abnormal findings: Secondary | ICD-10-CM

## 2022-06-01 DIAGNOSIS — R7301 Impaired fasting glucose: Secondary | ICD-10-CM

## 2022-06-01 DIAGNOSIS — F439 Reaction to severe stress, unspecified: Secondary | ICD-10-CM | POA: Diagnosis not present

## 2022-06-01 MED ORDER — ALPRAZOLAM 0.5 MG PO TABS: ORAL_TABLET | ORAL | 2 refills | Status: DC

## 2022-06-01 NOTE — Progress Notes (Signed)
Complete physical exam   Patient: Jordan Short   DOB: [birth year]   32 y.o. Female  MRN: [id number] Visit Date: 06/01/2022    Chief Complaint  Patient presents with   Annual Exam   Subjective    Jordan Short is a 32 y.o. female who presents today for a complete physical exam.  She reports consuming a  generally healthy  diet. Home exercise routine includes treadmill. She generally feels well. She does have additional problems to discuss today.   HPI  Annual physical  -sees GYN provider for well woman care  -due for routine, fasting labs  -was evaluated for right breast lump which she was able to palpate -did have mammogram and targeted ultrasound were negative.   GAD -still has intermittent panic attacks.  --when she has one, takes prescribed alprazolam 0.25 mg. Most of the time, has to take 2 tablets.  --single prescription will last her most of the year.   -she denies chest pain, chest pressure, or shortness of breath. She denies headaches or visual disturbances. She denies abdominal pain, nausea, vomiting, or changes in bowel or bladder habits.    History reviewed. No pertinent past medical history.  History reviewed. No pertinent surgical history.  Social History   Socioeconomic History   Marital status: Single    Spouse name: Not on file   Number of children: Not on file   Years of education: Not on file   Highest education level: Not on file  Occupational History   Not on file  Tobacco Use   Smoking status: Never   Smokeless tobacco: Never  Substance and Sexual Activity   Alcohol use: Never    Comment: occ   Drug use: Never   Sexual activity: Yes    Comment: NuvaRing  Other Topics Concern   Not on file  Social History Narrative   Not on file   Social Determinants of Health   Financial Resource Strain: Not on file  Food Insecurity: Not on file  Transportation Needs: Not on file  Physical Activity: Not on file  Stress: Not on file  Social Connections:  Not on file  Intimate Partner Violence: Not on file   Family Status  Relation Name Status   Mother  Alive   Father  Alive   MGF  (Not Specified)   Family History  Problem Relation Age of Onset   Hypertension Mother    Hypertension Father    Colon cancer Maternal Grandfather    No Known Allergies  Patient Care Team: Carlean Jews, NP as PCP - General (Family Medicine)   Medications: Outpatient Medications Prior to Visit  Medication Sig   etonogestrel-ethinyl estradiol (NUVARING) 0.12-0.015 MG/24HR vaginal ring Place 1 each vaginally every 28 (twenty-eight) days. Insert vaginally and leave in place for 3 consecutive weeks, then remove for 1 week.   [DISCONTINUED] ALPRAZolam (XANAX) 0.25 MG tablet Half to one tab tab as needed for panic attacks   [DISCONTINUED] dicyclomine (BENTYL) 20 MG tablet Take 1 tablet (20 mg total) by mouth 3 (three) times daily as needed for up to 5 days for spasms.   No facility-administered medications prior to visit.    Review of Systems See HPI    Last CBC Lab Results  Component Value Date   WBC 3.5 06/01/2022   HGB 13.0 06/01/2022   HCT 40.6 06/01/2022   MCV 97 06/01/2022   MCH 31.1 06/01/2022   RDW 12.3 06/01/2022   PLT 203 06/01/2022   Last metabolic  panel Lab Results  Component Value Date   GLUCOSE 96 06/01/2022   NA 141 06/01/2022   K 4.4 06/01/2022   CL 108 (H) 06/01/2022   CO2 18 (L) 06/01/2022   BUN 8 06/01/2022   CREATININE 0.74 06/01/2022   GFRNONAA >60 03/25/2022   CALCIUM 8.7 06/01/2022   PROT 7.0 06/01/2022   ALBUMIN 4.6 06/01/2022   LABGLOB 2.4 06/01/2022   AGRATIO 1.9 06/01/2022   BILITOT 0.2 06/01/2022   ALKPHOS 94 06/01/2022   AST 17 06/01/2022   ALT 12 06/01/2022   ANIONGAP 14 03/25/2022   Last lipids Lab Results  Component Value Date   CHOL 166 06/01/2022   HDL 74 06/01/2022   LDLCALC 80 06/01/2022   TRIG 60 06/01/2022   CHOLHDL 2.2 06/01/2022   Last hemoglobin A1c Lab Results  Component Value  Date   HGBA1C 5.2 06/01/2022   Last thyroid functions Lab Results  Component Value Date   TSH 2.950 06/01/2022   Last vitamin D Lab Results  Component Value Date   VD25OH 36.5 06/01/2022       Objective     Today's Vitals   06/01/22 0818  BP: 110/76  Pulse: 99  SpO2: (Abnormal) 74%  Weight: 155 lb (70.3 kg)  Height: 5\' 5"  (1.651 m)   Body mass index is 25.79 kg/m.  BP Readings from Last 3 Encounters:  06/01/22 110/76  03/25/22 111/84  01/13/21 117/77    Wt Readings from Last 3 Encounters:  06/01/22 155 lb (70.3 kg)  03/25/22 159 lb 6.3 oz (72.3 kg)  01/13/21 159 lb 8 oz (72.3 kg)     Physical Exam Vitals and nursing note reviewed.  Constitutional:      Appearance: Normal appearance. She is well-developed.  HENT:     Head: Normocephalic and atraumatic.     Right Ear: Tympanic membrane, ear canal and external ear normal.     Left Ear: Tympanic membrane, ear canal and external ear normal.     Nose: Nose normal.     Mouth/Throat:     Mouth: Mucous membranes are moist.     Pharynx: Oropharynx is clear.  Eyes:     Extraocular Movements: Extraocular movements intact.     Conjunctiva/sclera: Conjunctivae normal.     Pupils: Pupils are equal, round, and reactive to light.  Neck:     Vascular: No carotid bruit.  Cardiovascular:     Rate and Rhythm: Normal rate and regular rhythm.     Pulses: Normal pulses.     Heart sounds: Normal heart sounds.  Pulmonary:     Effort: Pulmonary effort is normal.     Breath sounds: Normal breath sounds.  Abdominal:     General: Bowel sounds are normal. There is no distension.     Palpations: Abdomen is soft. There is no mass.     Tenderness: There is no abdominal tenderness. There is no right CVA tenderness, left CVA tenderness, guarding or rebound.     Hernia: No hernia is present.  Musculoskeletal:        General: Normal range of motion.     Cervical back: Normal range of motion and neck supple.  Lymphadenopathy:      Cervical: No cervical adenopathy.  Skin:    General: Skin is warm and dry.     Capillary Refill: Capillary refill takes less than 2 seconds.  Neurological:     General: No focal deficit present.     Mental Status: She is alert and  oriented to person, place, and time.  Psychiatric:        Mood and Affect: Mood normal.        Behavior: Behavior normal.        Thought Content: Thought content normal.        Judgment: Judgment normal.     Last depression screening scores   Row Labels 06/01/2022    8:20 AM 01/13/2021    9:39 AM 10/27/2019    4:23 PM  PHQ 2/9 Scores   Section Header. No data exists in this row.     PHQ - 2 Score   0 1 0  PHQ- 9 Score   0 3    Last fall risk screening   Row Labels 01/13/2021    9:40 AM  Fall Risk    Section Header. No data exists in this row.   Falls in the past year?   0  Number falls in past yr:   0  Injury with Fall?   0  Follow up   Falls evaluation completed   Last Audit-C alcohol use screening   Row Labels 09/01/2019   10:06 AM  Alcohol Use Disorder Test (AUDIT)   Section Header. No data exists in this row.   1. How often do you have a drink containing alcohol?   1  2. How many drinks containing alcohol do you have on a typical day when you are drinking?   0   A score of 3 or more in women, and 4 or more in men indicates increased risk for alcohol abuse, EXCEPT if all of the points are from question 1   Results for orders placed or performed in visit on 06/01/22  CBC  Result Value Ref Range   WBC 3.5 3.4 - 10.8 x10E3/uL   RBC 4.18 3.77 - 5.28 x10E6/uL   Hemoglobin 13.0 11.1 - 15.9 g/dL   Hematocrit 11.9 14.7 - 46.6 %   MCV 97 79 - 97 fL   MCH 31.1 26.6 - 33.0 pg   MCHC 32.0 31.5 - 35.7 g/dL   RDW 82.9 56.2 - 13.0 %   Platelets 203 150 - 450 x10E3/uL  Comprehensive metabolic panel  Result Value Ref Range   Glucose 96 70 - 99 mg/dL   BUN 8 6 - 20 mg/dL   Creatinine, Ser 8.65 0.57 - 1.00 mg/dL   eGFR 784 >69 GE/XBM/8.41    BUN/Creatinine Ratio 11 9 - 23   Sodium 141 134 - 144 mmol/L   Potassium 4.4 3.5 - 5.2 mmol/L   Chloride 108 (H) 96 - 106 mmol/L   CO2 18 (L) 20 - 29 mmol/L   Calcium 8.7 8.7 - 10.2 mg/dL   Total Protein 7.0 6.0 - 8.5 g/dL   Albumin 4.6 3.9 - 4.9 g/dL   Globulin, Total 2.4 1.5 - 4.5 g/dL   Albumin/Globulin Ratio 1.9 1.2 - 2.2   Bilirubin Total 0.2 0.0 - 1.2 mg/dL   Alkaline Phosphatase 94 44 - 121 IU/L   AST 17 0 - 40 IU/L   ALT 12 0 - 32 IU/L  Lipid panel  Result Value Ref Range   Cholesterol, Total 166 100 - 199 mg/dL   Triglycerides 60 0 - 149 mg/dL   HDL 74 >32 mg/dL   VLDL Cholesterol Cal 12 5 - 40 mg/dL   LDL Chol Calc (NIH) 80 0 - 99 mg/dL   Chol/HDL Ratio 2.2 0.0 - 4.4 ratio  Hemoglobin A1c  Result Value Ref  Range   Hgb A1c MFr Bld 5.2 4.8 - 5.6 %   Est. average glucose Bld gHb Est-mCnc 103 mg/dL  VITAMIN D 25 Hydroxy (Vit-D Deficiency, Fractures)  Result Value Ref Range   Vit D, 25-Hydroxy 36.5 30.0 - 100.0 ng/mL  TSH + free T4  Result Value Ref Range   TSH 2.950 0.450 - 4.500 uIU/mL   Free T4 1.28 0.82 - 1.77 ng/dL    Assessment & Plan    Encounter for general adult medical examination with abnormal findings Assessment & Plan: Annual physical today.   Stress Assessment & Plan: Continue to take alprazolam.  Advised to take 0.5 mg p.o. daily if needed for acute anxiety. Prescription sent to pharmacy.  Orders: -     ALPRAZolam; Half to one tab tab as needed for panic attacksHalf to one tab tab as needed for panic attacks  Dispense: 30 tablet; Refill: 2  Other fatigue Assessment & Plan: Check labs for further evaluation.  Orders: -     TSH + free T4; Future -     Comprehensive metabolic panel; Future -     CBC; Future  Vitamin D deficiency Assessment & Plan: Check vitamin d level and treat deficiency as indicated.    Orders: -     VITAMIN D 25 Hydroxy (Vit-D Deficiency, Fractures); Future  Impaired fasting glucose Assessment & Plan: Check  hemoglobin A1c with fasting labs.  Orders: -     Hemoglobin A1c; Future -     Lipid panel; Future -     Comprehensive metabolic panel; Future  Anxiety Assessment & Plan: Continue to take alprazolam.  Advised to take 0.5 mg p.o. daily if needed for acute anxiety. Prescription sent to pharmacy.     There is no immunization history on file for this patient.  Health Maintenance  Topic Date Due   COVID-19 Vaccine (1) Never done   HIV Screening  Never done   DTaP/Tdap/Td (1 - Tdap) Never done   INFLUENZA VACCINE  08/09/2022   PAP SMEAR-Modifier  11/03/2023   HPV VACCINES  Aged Out   Hepatitis C Screening  Discontinued    Discussed health benefits of physical activity, and encouraged her to engage in regular exercise appropriate for her age and condition.   Return in about 1 year (around 06/01/2023) for health maintenance exam, FBW at time of visit.        Carlean Jews, NP  [facility] [phone] (phone) [phone] (fax)  [facility]

## 2022-06-02 LAB — CBC
Hematocrit: 40.6 % (ref 34.0–46.6)
Hemoglobin: 13 g/dL (ref 11.1–15.9)
MCH: 31.1 pg (ref 26.6–33.0)
MCHC: 32 g/dL (ref 31.5–35.7)
MCV: 97 fL (ref 79–97)
Platelets: 203 10*3/uL (ref 150–450)
RBC: 4.18 x10E6/uL (ref 3.77–5.28)
RDW: 12.3 % (ref 11.7–15.4)
WBC: 3.5 10*3/uL (ref 3.4–10.8)

## 2022-06-02 LAB — COMPREHENSIVE METABOLIC PANEL
ALT: 12 IU/L (ref 0–32)
AST: 17 IU/L (ref 0–40)
Albumin/Globulin Ratio: 1.9 (ref 1.2–2.2)
Albumin: 4.6 g/dL (ref 3.9–4.9)
Alkaline Phosphatase: 94 IU/L (ref 44–121)
BUN/Creatinine Ratio: 11 (ref 9–23)
BUN: 8 mg/dL (ref 6–20)
Bilirubin Total: 0.2 mg/dL (ref 0.0–1.2)
CO2: 18 mmol/L — ABNORMAL LOW (ref 20–29)
Calcium: 8.7 mg/dL (ref 8.7–10.2)
Chloride: 108 mmol/L — ABNORMAL HIGH (ref 96–106)
Creatinine, Ser: 0.74 mg/dL (ref 0.57–1.00)
Globulin, Total: 2.4 g/dL (ref 1.5–4.5)
Glucose: 96 mg/dL (ref 70–99)
Potassium: 4.4 mmol/L (ref 3.5–5.2)
Sodium: 141 mmol/L (ref 134–144)
Total Protein: 7 g/dL (ref 6.0–8.5)
eGFR: 109 mL/min/{1.73_m2} (ref >59)

## 2022-06-02 LAB — TSH+FREE T4
Free T4: 1.28 ng/dL (ref 0.82–1.77)
TSH: 2.95 u[IU]/mL (ref 0.450–4.500)

## 2022-06-02 LAB — LIPID PANEL
Chol/HDL Ratio: 2.2 ratio (ref 0.0–4.4)
Cholesterol, Total: 166 mg/dL (ref 100–199)
HDL: 74 mg/dL
LDL Chol Calc (NIH): 80 mg/dL (ref 0–99)
Triglycerides: 60 mg/dL (ref 0–149)
VLDL Cholesterol Cal: 12 mg/dL (ref 5–40)

## 2022-06-02 LAB — HEMOGLOBIN A1C
Est. average glucose Bld gHb Est-mCnc: 103 mg/dL
Hgb A1c MFr Bld: 5.2 % (ref 4.8–5.6)

## 2022-06-02 LAB — VITAMIN D 25 HYDROXY (VIT D DEFICIENCY, FRACTURES): Vit D, 25-Hydroxy: 36.5 ng/mL (ref 30.0–100.0)

## 2022-06-11 DIAGNOSIS — R7301 Impaired fasting glucose: Secondary | ICD-10-CM | POA: Insufficient documentation

## 2022-06-11 NOTE — Assessment & Plan Note (Signed)
Continue to take alprazolam.  Advised to take 0.5 mg p.o. daily if needed for acute anxiety. Prescription sent to pharmacy.

## 2022-06-11 NOTE — Assessment & Plan Note (Signed)
Continue to take alprazolam.  Advised to take 0.5 mg p.o. daily if needed for acute anxiety. Prescription sent to pharmacy. 

## 2022-06-11 NOTE — Assessment & Plan Note (Signed)
Check labs for further evaluation.  

## 2022-06-11 NOTE — Assessment & Plan Note (Signed)
Check hemoglobin A1c with fasting labs. 

## 2022-06-11 NOTE — Assessment & Plan Note (Signed)
Annual physical today. 

## 2022-06-11 NOTE — Assessment & Plan Note (Signed)
Check vitamin d level and treat deficiency as indicated.   

## 2023-02-18 ENCOUNTER — Ambulatory Visit: Payer: 59

## 2023-02-18 ENCOUNTER — Ambulatory Visit: Payer: 59 | Admitting: Podiatry

## 2023-02-18 ENCOUNTER — Encounter: Payer: Self-pay | Admitting: Podiatry

## 2023-02-18 DIAGNOSIS — M79672 Pain in left foot: Secondary | ICD-10-CM

## 2023-02-18 DIAGNOSIS — G5762 Lesion of plantar nerve, left lower limb: Secondary | ICD-10-CM | POA: Diagnosis not present

## 2023-02-18 MED ORDER — TRIAMCINOLONE ACETONIDE 10 MG/ML IJ SUSP
2.5000 mg | Freq: Once | INTRAMUSCULAR | Status: AC
  Administered 2023-02-18: 2.5 mg via INTRA_ARTICULAR

## 2023-02-18 MED ORDER — DEXAMETHASONE SODIUM PHOSPHATE 120 MG/30ML IJ SOLN
4.0000 mg | Freq: Once | INTRAMUSCULAR | Status: AC
  Administered 2023-02-18: 4 mg via INTRA_ARTICULAR

## 2023-02-18 MED ORDER — MELOXICAM 15 MG PO TABS: 15.0000 mg | ORAL_TABLET | Freq: Every day | ORAL | 0 refills | Status: AC

## 2023-02-18 NOTE — Progress Notes (Signed)
  Subjective:  Patient ID: Jordan Short, female    DOB: 09-20-1990,   MRN: [id number]  No chief complaint on file.   33 y.o. female presents for concern of a popping sensation in the ball of her left foot when walking. Relates this has been ongoing for couple month. Denies a lot of pain but was just concerned about he popping. Relates some irritation and tingling type sensation on occasion  Denies any treatments  . Denies any other pedal complaints. Denies n/v/f/c.   History reviewed. No pertinent past medical history.  Objective:  Physical Exam: Vascular: DP/PT pulses 2/4 bilateral. CFT <3 seconds. Normal hair growth on digits. No edema.  Skin. No lacerations or abrasions bilateral feet.  Musculoskeletal: MMT 5/5 bilateral lower extremities in DF, PF, Inversion and Eversion. Deceased ROM in DF of ankle joint. Tender to the third interspace area plantar ly upon palpation. Some discomfort with metatarsal squeeze. Palpable mulder's click noted to third interspace.  Neurological: Sensation intact to light touch.   Assessment:   1. Morton's neuroma, left      Plan:  Patient was evaluated and treated and all questions answered. Discussed neuroma and treatment options with patient.  Radiographs reviewed and discussed with patient. No acute fractures or dislocations noted.  Injection offered today. Patient in agreement. Procedure below.  Discussed padding and offloading today.  Prescription for meloxicam provided. Previous kidney function labs wnl.  Discussed if pain does not improve may consider  MRI for further surgical planning.  Patient to return in 6 weeks or sooner if concerns arise.    Procedure: Injection Tendon/Ligament Discussed alternatives, risks, complications and verbal consent was obtained.  Location: Left third interspace. Skin Prep: Alcohol. Injectate: 1cc 0.5% marcaine plain, 1 cc dexamethasone 0.5 cc kenalog  Disposition: Patient tolerated procedure well. Injection  site dressed with a band-aid.  Post-injection care was discussed and return precautions discussed.     Louann Sjogren, DPM

## 2023-06-04 ENCOUNTER — Encounter: Payer: 59 | Admitting: Family Medicine

## 2023-06-27 ENCOUNTER — Ambulatory Visit (INDEPENDENT_AMBULATORY_CARE_PROVIDER_SITE_OTHER)

## 2023-06-27 VITALS — BP 112/82 | HR 85 | Ht 65.0 in | Wt 145.1 lb

## 2023-06-27 DIAGNOSIS — E559 Vitamin D deficiency, unspecified: Secondary | ICD-10-CM | POA: Diagnosis not present

## 2023-06-27 DIAGNOSIS — Z0001 Encounter for general adult medical examination with abnormal findings: Secondary | ICD-10-CM

## 2023-06-27 DIAGNOSIS — Z23 Encounter for immunization: Secondary | ICD-10-CM | POA: Diagnosis not present

## 2023-06-27 DIAGNOSIS — F419 Anxiety disorder, unspecified: Secondary | ICD-10-CM

## 2023-06-27 DIAGNOSIS — R7301 Impaired fasting glucose: Secondary | ICD-10-CM

## 2023-06-27 DIAGNOSIS — R5383 Other fatigue: Secondary | ICD-10-CM | POA: Diagnosis not present

## 2023-06-27 DIAGNOSIS — F439 Reaction to severe stress, unspecified: Secondary | ICD-10-CM

## 2023-06-27 MED ORDER — ALPRAZOLAM 0.5 MG PO TABS: ORAL_TABLET | ORAL | 2 refills | Status: AC

## 2023-06-27 NOTE — Assessment & Plan Note (Signed)
 Annual physical today. Basic screening labs ordered today (see orders). Counseled patient on importance of well balanced diet and weekly exercise to maintain health. TDAP vaccine administered today. Pap UTD through OB/GYN. Will plan for yearly physical with acute visits as needed in between.

## 2023-06-27 NOTE — Assessment & Plan Note (Signed)
 Continue to take alprazolam  0.5 mg as needed for acute anxiety attacks. PDMP reviewed, no aberrancies. 3  month supply sent in to pharmacy today to last 1 year. Will continue to monitor.

## 2023-06-27 NOTE — Assessment & Plan Note (Signed)
 Checking vitamin D  today and will treat deficiency as indicated.

## 2023-06-27 NOTE — Patient Instructions (Addendum)
 It was nice to see you today!  As we discussed in clinic:  -Continue your current medications as prescribed.  -Continue eating a well balanced diet and exercising weekly.  -Recommend a 5-10 mg fiber supplement with at least 80 oz of water per day to regulate bowel movements.  -We are getting blood work from you today. I will send you a MyChart message in the coming days explaining the results.   -It was nice to meet you today! I will plan to see you in 1 year for your physical!   If you have any problems before your next visit feel free to message me via MyChart (minor issues or questions) or call the office, otherwise you may reach out to schedule an office visit.  Thank you! Meryl Acosta, PA-C

## 2023-06-27 NOTE — Progress Notes (Signed)
 Complete physical exam  Patient: Jordan Short   DOB: 1990-08-01   33 y.o. Female  MRN: [id number]  Subjective:    Chief Complaint  Patient presents with   Annual Exam    Jordan Short is a 33 y.o. female who presents today for a complete physical exam. She reports consuming a general diet. She exercises multiple times per week by walking on her treadmill with incline and light weight training She generally feels well. She reports sleeping fairly well. Bowel movements are mostly regular. She sees an eye doctor and a dentist regularly. She does not have additional problems to discuss today.   She follows with [facility] for well woman/pap smears ([facility])   Patient also takes alprazolam  0.5 mg as needed for anxiety. 30 pills will last her about 4 months. Reports moods are overall stable and the alprazolam  helps her when she experiences a panic attack.  Most recent fall risk assessment:    06/27/2023    8:42 AM  Fall Risk   Falls in the past year? 0  Risk for fall due to : No Fall Risks  Follow up Falls evaluation completed     Most recent depression screenings:    06/27/2023    8:42 AM 06/01/2022    8:20 AM  PHQ 2/9 Scores  PHQ - 2 Score 0 0  PHQ- 9 Score 0 0        Patient Care Team: Melene Sportsman as PCP - General (Physician Assistant)   Outpatient Medications Prior to Visit  Medication Sig   etonogestrel-ethinyl estradiol (NUVARING) 0.12-0.015 MG/24HR vaginal ring Place 1 each vaginally every 28 (twenty-eight) days. Insert vaginally and leave in place for 3 consecutive weeks, then remove for 1 week.   meloxicam  (MOBIC ) 15 MG tablet Take 1 tablet (15 mg total) by mouth daily.   [DISCONTINUED] ALPRAZolam  (XANAX ) 0.5 MG tablet Half to one tab tab as needed for panic attacksHalf to one tab tab as needed for panic attacks   No facility-administered medications prior to visit.    ROS  As noted in HPI     Objective:     BP 112/82   Pulse 85   Ht 5' 5  (1.651 m)   Wt 145 lb 1.9 oz (65.8 kg)   LMP 06/23/2023 (Approximate)   SpO2 98%   BMI 24.15 kg/m    Physical Exam Constitutional:      General: She is not in acute distress.    Appearance: Normal appearance.  HENT:     Right Ear: Tympanic membrane normal.     Left Ear: Tympanic membrane normal.     Mouth/Throat:     Mouth: Mucous membranes are moist.     Pharynx: Oropharynx is clear.   Eyes:     Pupils: Pupils are equal, round, and reactive to light.    Cardiovascular:     Rate and Rhythm: Normal rate and regular rhythm.     Heart sounds: Normal heart sounds. No murmur heard.    No friction rub. No gallop.  Pulmonary:     Effort: Pulmonary effort is normal. No respiratory distress.     Breath sounds: Normal breath sounds.  Abdominal:     General: Abdomen is flat.     Palpations: Abdomen is soft.     Comments: Normoactive bowel sounds   Musculoskeletal:        General: No swelling.     Cervical back: Neck supple.  Lymphadenopathy:  Cervical: No cervical adenopathy.   Skin:    General: Skin is warm and dry.   Neurological:     General: No focal deficit present.     Mental Status: She is alert.   Psychiatric:        Mood and Affect: Mood normal.        Behavior: Behavior normal.        Thought Content: Thought content normal.       No results found for any visits on 06/27/23.     Assessment & Plan:    Routine Health Maintenance and Physical Exam  Health Maintenance  Topic Date Due   HIV Screening  Never done   HPV Vaccine (1 - 3-dose SCDM series) Never done   Pap with HPV screening  Never done   COVID-19 Vaccine (1 - 2024-25 season) Never done   Flu Shot  08/09/2023   DTaP/Tdap/Td vaccine (2 - Td or Tdap) 06/26/2033   Meningitis B Vaccine  Aged Out   Hepatitis C Screening  Discontinued    Discussed health benefits of physical activity, and encouraged her to engage in regular exercise appropriate for her age and condition.  Encounter for  general adult medical examination with abnormal findings Assessment & Plan: Annual physical today. Basic screening labs ordered today (see orders). Counseled patient on importance of well balanced diet and weekly exercise to maintain health. TDAP vaccine administered today. Pap UTD through OB/GYN. Will plan for yearly physical with acute visits as needed in between.  Orders: -     CBC with Differential/Platelet -     Comprehensive metabolic panel with GFR -     Lipid panel -     Hemoglobin A1c -     TSH -     VITAMIN D  25 Hydroxy (Vit-D Deficiency, Fractures)  Other fatigue -     CBC with Differential/Platelet -     TSH  Vitamin D  deficiency Assessment & Plan: Checking vitamin D  today and will treat deficiency as indicated.  Orders: -     VITAMIN D  25 Hydroxy (Vit-D Deficiency, Fractures)  Impaired fasting glucose -     Comprehensive metabolic panel with GFR -     Lipid panel -     Hemoglobin A1c  Encounter for vaccination -     Tdap vaccine greater than or equal to 7yo IM  Stress -     ALPRAZolam ; Half to one tab tab as needed for panic attacksHalf to one tab tab as needed for panic attacks  Dispense: 30 tablet; Refill: 2  Anxiety Assessment & Plan: Continue to take alprazolam  0.5 mg as needed for acute anxiety attacks. PDMP reviewed, no aberrancies. 3  month supply sent in to pharmacy today to last 1 year. Will continue to monitor.    Return in about 1 year (around 06/26/2024) for Physical.    Odilia Bennett, PA-C

## 2023-07-01 ENCOUNTER — Other Ambulatory Visit

## 2023-07-03 ENCOUNTER — Other Ambulatory Visit

## 2023-07-04 ENCOUNTER — Ambulatory Visit: Payer: Self-pay

## 2023-07-04 LAB — LIPID PANEL
Chol/HDL Ratio: 2.4 ratio (ref 0.0–4.4)
Cholesterol, Total: 163 mg/dL (ref 100–199)
HDL: 68 mg/dL (ref >39)
LDL Chol Calc (NIH): 80 mg/dL (ref 0–99)
Triglycerides: 80 mg/dL (ref 0–149)
VLDL Cholesterol Cal: 15 mg/dL (ref 5–40)

## 2023-07-04 LAB — CBC WITH DIFFERENTIAL/PLATELET
Basophils Absolute: 0 10*3/uL (ref 0.0–0.2)
Basos: 1 %
EOS (ABSOLUTE): 0.3 10*3/uL (ref 0.0–0.4)
Eos: 8 %
Hematocrit: 40 % (ref 34.0–46.6)
Hemoglobin: 12.9 g/dL (ref 11.1–15.9)
Immature Grans (Abs): 0 10*3/uL (ref 0.0–0.1)
Immature Granulocytes: 0 %
Lymphocytes Absolute: 1.8 10*3/uL (ref 0.7–3.1)
Lymphs: 44 %
MCH: 31.9 pg (ref 26.6–33.0)
MCHC: 32.3 g/dL (ref 31.5–35.7)
MCV: 99 fL — ABNORMAL HIGH (ref 79–97)
Monocytes Absolute: 0.3 10*3/uL (ref 0.1–0.9)
Monocytes: 8 %
Neutrophils Absolute: 1.5 10*3/uL (ref 1.4–7.0)
Neutrophils: 39 %
Platelets: 210 10*3/uL (ref 150–450)
RBC: 4.05 x10E6/uL (ref 3.77–5.28)
RDW: 12 % (ref 11.7–15.4)
WBC: 4 10*3/uL (ref 3.4–10.8)

## 2023-07-04 LAB — COMPREHENSIVE METABOLIC PANEL WITH GFR
ALT: 6 IU/L (ref 0–32)
AST: 13 IU/L (ref 0–40)
Albumin: 4.3 g/dL (ref 3.9–4.9)
Alkaline Phosphatase: 109 IU/L (ref 44–121)
BUN/Creatinine Ratio: 12 (ref 9–23)
BUN: 9 mg/dL (ref 6–20)
Bilirubin Total: 0.4 mg/dL (ref 0.0–1.2)
CO2: 16 mmol/L — ABNORMAL LOW (ref 20–29)
Calcium: 8.6 mg/dL — ABNORMAL LOW (ref 8.7–10.2)
Chloride: 98 mmol/L (ref 96–106)
Creatinine, Ser: 0.78 mg/dL (ref 0.57–1.00)
Globulin, Total: 2.3 g/dL (ref 1.5–4.5)
Glucose: 88 mg/dL (ref 70–99)
Potassium: 4.2 mmol/L (ref 3.5–5.2)
Sodium: 133 mmol/L — ABNORMAL LOW (ref 134–144)
Total Protein: 6.6 g/dL (ref 6.0–8.5)
eGFR: 102 mL/min/{1.73_m2} (ref >59)

## 2023-07-04 LAB — TSH: TSH: 3.4 u[IU]/mL (ref 0.450–4.500)

## 2023-07-04 LAB — HEMOGLOBIN A1C
Est. average glucose Bld gHb Est-mCnc: 100 mg/dL
Hgb A1c MFr Bld: 5.1 % (ref 4.8–5.6)

## 2023-07-04 LAB — VITAMIN D 25 HYDROXY (VIT D DEFICIENCY, FRACTURES): Vit D, 25-Hydroxy: 46.5 ng/mL (ref 30.0–100.0)
